# Patient Record
Sex: Female | Born: 1955
Health system: Southern US, Community
[De-identification: ages and names within clinical notes are randomized; demographics above are authoritative.]

## PROBLEM LIST (undated history)

## (undated) DIAGNOSIS — K228 Other specified diseases of esophagus: Secondary | ICD-10-CM

## (undated) DIAGNOSIS — N809 Endometriosis, unspecified: Secondary | ICD-10-CM

## (undated) DIAGNOSIS — D122 Benign neoplasm of ascending colon: Secondary | ICD-10-CM

## (undated) DIAGNOSIS — F4323 Adjustment disorder with mixed anxiety and depressed mood: Secondary | ICD-10-CM

## (undated) DIAGNOSIS — K2289 Other specified disease of esophagus: Secondary | ICD-10-CM

## (undated) DIAGNOSIS — F419 Anxiety disorder, unspecified: Secondary | ICD-10-CM

## (undated) DIAGNOSIS — N816 Rectocele: Secondary | ICD-10-CM

## (undated) DIAGNOSIS — I10 Essential (primary) hypertension: Secondary | ICD-10-CM

## (undated) DIAGNOSIS — I493 Ventricular premature depolarization: Secondary | ICD-10-CM

## (undated) DIAGNOSIS — K219 Gastro-esophageal reflux disease without esophagitis: Secondary | ICD-10-CM

## (undated) DIAGNOSIS — I499 Cardiac arrhythmia, unspecified: Secondary | ICD-10-CM

## (undated) HISTORY — PX: BLADDER SURGERY: SHX569

## (undated) HISTORY — DX: Other specified disease of esophagus: K22.89

## (undated) HISTORY — DX: Anxiety disorder, unspecified: F41.9

## (undated) HISTORY — PX: PELVIC LAPAROSCOPY: SHX162

## (undated) HISTORY — DX: Adjustment disorder with mixed anxiety and depressed mood: F43.23

## (undated) HISTORY — DX: Rectocele: N81.6

## (undated) HISTORY — DX: Ventricular premature depolarization: I49.3

## (undated) HISTORY — DX: Endometriosis, unspecified: N80.9

## (undated) HISTORY — DX: Gastro-esophageal reflux disease without esophagitis: K21.9

## (undated) HISTORY — DX: Other specified diseases of esophagus: K22.8

## (undated) HISTORY — DX: Benign neoplasm of ascending colon: D12.2

---

## 1999-11-14 ENCOUNTER — Other Ambulatory Visit: Admission: RE | Admit: 1999-11-14 | Discharge: 1999-11-14 | Payer: Self-pay | Admitting: Obstetrics and Gynecology

## 2000-11-19 ENCOUNTER — Other Ambulatory Visit: Admission: RE | Admit: 2000-11-19 | Discharge: 2000-11-19 | Payer: Self-pay | Admitting: Obstetrics and Gynecology

## 2000-12-05 ENCOUNTER — Ambulatory Visit (HOSPITAL_COMMUNITY): Admission: RE | Admit: 2000-12-05 | Discharge: 2000-12-05 | Payer: Self-pay | Admitting: Obstetrics and Gynecology

## 2000-12-05 ENCOUNTER — Encounter: Payer: Self-pay | Admitting: Obstetrics and Gynecology

## 2001-12-09 ENCOUNTER — Other Ambulatory Visit: Admission: RE | Admit: 2001-12-09 | Discharge: 2001-12-09 | Payer: Self-pay | Admitting: Obstetrics and Gynecology

## 2002-01-13 ENCOUNTER — Encounter: Payer: Self-pay | Admitting: Obstetrics and Gynecology

## 2002-01-13 ENCOUNTER — Ambulatory Visit (HOSPITAL_COMMUNITY): Admission: RE | Admit: 2002-01-13 | Discharge: 2002-01-13 | Payer: Self-pay | Admitting: Obstetrics and Gynecology

## 2003-01-12 ENCOUNTER — Other Ambulatory Visit: Admission: RE | Admit: 2003-01-12 | Discharge: 2003-01-12 | Payer: Self-pay | Admitting: Obstetrics and Gynecology

## 2003-02-02 ENCOUNTER — Ambulatory Visit (HOSPITAL_COMMUNITY): Admission: RE | Admit: 2003-02-02 | Discharge: 2003-02-02 | Payer: Self-pay | Admitting: Obstetrics and Gynecology

## 2003-02-02 ENCOUNTER — Encounter: Payer: Self-pay | Admitting: Obstetrics and Gynecology

## 2004-02-29 ENCOUNTER — Other Ambulatory Visit: Admission: RE | Admit: 2004-02-29 | Discharge: 2004-02-29 | Payer: Self-pay | Admitting: Obstetrics and Gynecology

## 2004-06-05 ENCOUNTER — Ambulatory Visit (HOSPITAL_COMMUNITY): Admission: RE | Admit: 2004-06-05 | Discharge: 2004-06-05 | Payer: Self-pay | Admitting: Obstetrics and Gynecology

## 2006-09-02 ENCOUNTER — Ambulatory Visit: Payer: Self-pay

## 2007-01-15 ENCOUNTER — Ambulatory Visit: Payer: Self-pay | Admitting: Gastroenterology

## 2007-12-24 ENCOUNTER — Ambulatory Visit: Payer: Self-pay | Admitting: Internal Medicine

## 2011-01-18 ENCOUNTER — Ambulatory Visit: Payer: Self-pay | Admitting: Orthopedic Surgery

## 2011-04-26 ENCOUNTER — Ambulatory Visit: Payer: Self-pay | Admitting: Orthopedic Surgery

## 2011-05-03 ENCOUNTER — Ambulatory Visit: Payer: Self-pay | Admitting: Orthopedic Surgery

## 2011-06-07 ENCOUNTER — Ambulatory Visit: Payer: Self-pay | Admitting: Internal Medicine

## 2014-01-13 ENCOUNTER — Other Ambulatory Visit (HOSPITAL_COMMUNITY)
Admission: RE | Admit: 2014-01-13 | Discharge: 2014-01-13 | Disposition: A | Discharge status: Home / Self Care | Payer: BC Managed Care – PPO | Source: Ambulatory Visit | Attending: Gynecology | Admitting: Gynecology

## 2014-01-13 ENCOUNTER — Ambulatory Visit (INDEPENDENT_AMBULATORY_CARE_PROVIDER_SITE_OTHER): Payer: BC Managed Care – PPO | Admitting: Women's Health

## 2014-01-13 ENCOUNTER — Encounter: Payer: Self-pay | Admitting: Women's Health

## 2014-01-13 VITALS — BP 118/74 | Ht 65.0 in | Wt 146.0 lb

## 2014-01-13 DIAGNOSIS — F4323 Adjustment disorder with mixed anxiety and depressed mood: Secondary | ICD-10-CM

## 2014-01-13 DIAGNOSIS — Z780 Asymptomatic menopausal state: Secondary | ICD-10-CM

## 2014-01-13 DIAGNOSIS — Z01419 Encounter for gynecological examination (general) (routine) without abnormal findings: Secondary | ICD-10-CM | POA: Diagnosis present

## 2014-01-13 DIAGNOSIS — N816 Rectocele: Secondary | ICD-10-CM

## 2014-01-13 DIAGNOSIS — N898 Other specified noninflammatory disorders of vagina: Secondary | ICD-10-CM

## 2014-01-13 HISTORY — DX: Adjustment disorder with mixed anxiety and depressed mood: F43.23

## 2014-01-13 HISTORY — DX: Rectocele: N81.6

## 2014-01-13 LAB — WET PREP FOR TRICH, YEAST, CLUE
CLUE CELLS WET PREP: NONE SEEN
Trich, Wet Prep: NONE SEEN
Yeast Wet Prep HPF POC: NONE SEEN

## 2014-01-13 MED ORDER — FLUOXETINE HCL 20 MG PO TABS: 20.0000 mg | ORAL_TABLET | Freq: Every day | ORAL | Status: DC

## 2014-01-13 MED ORDER — ESTRADIOL ACETATE 0.05 MG/24HR VA RING: VAGINAL_RING | VAGINAL | Status: DC

## 2014-01-13 MED ORDER — PROGESTERONE MICRONIZED 200 MG PO CAPS: ORAL_CAPSULE | ORAL | Status: DC

## 2014-01-13 NOTE — Addendum Note (Signed)
Addended by: Alen Blew on: 01/13/2014 12:22 PM   Modules accepted: Orders

## 2014-01-13 NOTE — Patient Instructions (Signed)
Health Recommendations for Postmenopausal Women Respected and ongoing research has looked at the most common causes of death, disability, and poor quality of life in postmenopausal women. The causes include heart disease, diseases of blood vessels, diabetes, depression, cancer, and bone loss (osteoporosis). Many things can be done to help lower the chances of developing these and other common problems. CARDIOVASCULAR DISEASE Heart Disease: A heart attack is a medical emergency. Know the signs and symptoms of a heart attack. Below are things women can do to reduce their risk for heart disease.   Do not smoke. If you smoke, quit.  Aim for a healthy weight. Being overweight causes many preventable deaths. Eat a healthy and balanced diet and drink an adequate amount of liquids.  Get moving. Make a commitment to be more physically active. Aim for 30 minutes of activity on most, if not all days of the week.  Eat for heart health. Choose a diet that is low in saturated fat and cholesterol and eliminate trans fat. Include whole grains, vegetables, and fruits. Read and understand the labels on food containers before buying.  Know your numbers. Ask your caregiver to check your blood pressure, cholesterol (total, HDL, LDL, triglycerides) and blood glucose. Work with your caregiver on improving your entire clinical picture.  High blood pressure. Limit or stop your table salt intake (try salt substitute and food seasonings). Avoid salty foods and drinks. Read labels on food containers before buying. Eating well and exercising can help control high blood pressure. STROKE  Stroke is a medical emergency. Stroke may be the result of a blood clot in a blood vessel in the brain or by a brain hemorrhage (bleeding). Know the signs and symptoms of a stroke. To lower the risk of developing a stroke:  Avoid fatty foods.  Quit smoking.  Control your diabetes, blood pressure, and irregular heart rate. THROMBOPHLEBITIS  (BLOOD CLOT) OF THE LEG  Becoming overweight and leading a stationary lifestyle may also contribute to developing blood clots. Controlling your diet and exercising will help lower the risk of developing blood clots. CANCER SCREENING  Breast Cancer: Take steps to reduce your risk of breast cancer.  You should practice "breast self-awareness." This means understanding the normal appearance and feel of your breasts and should include breast self-examination. Any changes detected, no matter how small, should be reported to your caregiver.  After age 40, you should have a clinical breast exam (CBE) every year.  Starting at age 40, you should consider having a mammogram (breast X-ray) every year.  If you have a family history of breast cancer, talk to your caregiver about genetic screening.  If you are at high risk for breast cancer, talk to your caregiver about having an MRI and a mammogram every year.  Intestinal or Stomach Cancer: Tests to consider are a rectal exam, fecal occult blood, sigmoidoscopy, and colonoscopy. Women who are high risk may need to be screened at an earlier age and more often.  Cervical Cancer:  Beginning at age 30, you should have a Pap test every 3 years as long as the past 3 Pap tests have been normal.  If you have had past treatment for cervical cancer or a condition that could lead to cancer, you need Pap tests and screening for cancer for at least 20 years after your treatment.  If you had a hysterectomy for a problem that was not cancer or a condition that could lead to cancer, then you no longer need Pap tests.    If you are between ages 65 and 70, and you have had normal Pap tests going back 10 years, you no longer need Pap tests.  If Pap tests have been discontinued, risk factors (such as a new sexual partner) need to be reassessed to determine if screening should be resumed.  Some medical problems can increase the chance of getting cervical cancer. In these  cases, your caregiver may recommend more frequent screening and Pap tests.  Uterine Cancer: If you have vaginal bleeding after reaching menopause, you should notify your caregiver.  Ovarian Cancer: Other than yearly pelvic exams, there are no reliable tests available to screen for ovarian cancer at this time except for yearly pelvic exams.  Lung Cancer: Yearly chest X-rays can detect lung cancer and should be done on high risk women, such as cigarette smokers and women with chronic lung disease (emphysema).  Skin Cancer: A complete body skin exam should be done at your yearly examination. Avoid overexposure to the sun and ultraviolet light lamps. Use a strong sun block cream when in the sun. All of these things are important for lowering the risk of skin cancer. MENOPAUSE Menopause Symptoms: Hormone therapy products are effective for treating symptoms associated with menopause:  Moderate to severe hot flashes.  Night sweats.  Mood swings.  Headaches.  Tiredness.  Loss of sex drive.  Insomnia.  Other symptoms. Hormone replacement carries certain risks, especially in older women. Women who use or are thinking about using estrogen or estrogen with progestin treatments should discuss that with their caregiver. Your caregiver will help you understand the benefits and risks. The ideal dose of hormone replacement therapy is not known. The Food and Drug Administration (FDA) has concluded that hormone therapy should be used only at the lowest doses and for the shortest amount of time to reach treatment goals.  OSTEOPOROSIS Protecting Against Bone Loss and Preventing Fracture If you use hormone therapy for prevention of bone loss (osteoporosis), the risks for bone loss must outweigh the risk of the therapy. Ask your caregiver about other medications known to be safe and effective for preventing bone loss and fractures. To guard against bone loss or fractures, the following is recommended:  If  you are younger than age 50, take 1000 mg of calcium and at least 600 mg of Vitamin D per day.  If you are older than age 50 but younger than age 70, take 1200 mg of calcium and at least 600 mg of Vitamin D per day.  If you are older than age 70, take 1200 mg of calcium and at least 800 mg of Vitamin D per day. Smoking and excessive alcohol intake increases the risk of osteoporosis. Eat foods rich in calcium and vitamin D and do weight bearing exercises several times a week as your caregiver suggests. DIABETES Diabetes Mellitus: If you have type I or type 2 diabetes, you should keep your blood sugar under control with diet, exercise, and recommended medication. Avoid starchy and fatty foods, and too many sweets. Being overweight can make diabetes control more difficult. COGNITION AND MEMORY Cognition and Memory: Menopausal hormone therapy is not recommended for the prevention of cognitive disorders such as Alzheimer's disease or memory loss.  DEPRESSION  Depression may occur at any age, but it is common in elderly women. This may be because of physical, medical, social (loneliness), or financial problems and needs. If you are experiencing depression because of medical problems and control of symptoms, talk to your caregiver about this. Physical   activity and exercise may help with mood and sleep. Community and volunteer involvement may improve your sense of value and worth. If you have depression and you feel that the problem is getting worse or becoming severe, talk to your caregiver about which treatment options are best for you. ACCIDENTS  Accidents are common and can be serious in elderly woman. Prepare your house to prevent accidents. Eliminate throw rugs, place hand bars in bath, shower, and toilet areas. Avoid wearing high heeled shoes or walking on wet, snowy, and icy areas. Limit or stop driving if you have vision or hearing problems, or if you feel you are unsteady with your movements and  reflexes. HEPATITIS C Hepatitis C is a type of viral infection affecting the liver. It is spread mainly through contact with blood from an infected person. It can be treated, but if left untreated, it can lead to severe liver damage over the years. Many people who are infected do not know that the virus is in their blood. If you are a "baby-boomer", it is recommended that you have one screening test for Hepatitis C. IMMUNIZATIONS  Several immunizations are important to consider having during your senior years, including:   Tetanus, diphtheria, and pertussis booster shot.  Influenza every year before the flu season begins.  Pneumonia vaccine.  Shingles vaccine.  Others, as indicated based on your specific needs. Talk to your caregiver about these. Document Released: 06/08/2005 Document Revised: 08/31/2013 Document Reviewed: 02/02/2008 ExitCare Patient Information 2015 [employer]. This information is not intended to replace advice given to you by your health care provider. Make sure you discuss any questions you have with your health care provider.  

## 2014-01-13 NOTE — Progress Notes (Signed)
Connie Thomas 09-01-1955 [id number]    History:    Presents for annual exam. Former patient of Dr. Cherylann Thomas. Has been seen at a physician's office in [place], reports normal Paps and mammograms. Negative colonoscopy at 46. Has not had a bone density. Postmenopausal with no bleeding on FemHRT. Urge incontinence, vaginal dryness/irritation and discomfort with intercourse.  Has been on Cymbalta for the past 2 years and beginning to have increased anxiety symptoms. Had been on Prozac in the past. Counseling in the past, denies need at this time.  Past medical history, past surgical history, family history and social history were all reviewed and documented in the EPIC chart. Retired, taking care of 2 grandchildren 4 days a week.  ROS:  A  12 point ROS was performed and pertinent positives and negatives are included.  Exam:  Filed Vitals:   01/13/14 0834  BP: 118/74    General appearance:  Normal Thyroid:  Symmetrical, normal in size, without palpable masses or nodularity. Respiratory  Auscultation:  Clear without wheezing or rhonchi Cardiovascular  Auscultation:  Regular rate, without rubs, murmurs or gallops  Edema/varicosities:  Not grossly evident Abdominal  Soft,nontender, without masses, guarding or rebound.  Liver/spleen:  No organomegaly noted  Hernia:  None appreciated  Skin  Inspection:  Grossly normal   Breasts: Examined lying and sitting.     Right: Without masses, retractions, discharge or axillary adenopathy.     Left: Without masses, retractions, discharge or axillary adenopathy. Gentitourinary   Inguinal/mons:  Normal without inguinal adenopathy  External genitalia:  Normal  BUS/Urethra/Skene's glands:  Normal  Vagina: +1 rectocele wet prep negative   Cervix:  Normal  Uterus:  normal in size, shape and contour.  Midline and mobile  Adnexa/parametria:     Rt: Without masses or tenderness.   Lt: Without masses or tenderness.  Anus and  perineum: Normal  Digital rectal exam: Normal sphincter tone without palpated masses or tenderness  Assessment/Plan:  58 y.o. MWF G1P1 and 1 adopted son for annual exam.   +1 rectocele/urge incontinence Postmenopausal with no bleeding on FemHRT Anxiety/depression increased symptoms after 2 years on Cymbalta  Plan: Instructed to schedule bone density. Home safety, fall prevention, importance of regular exercise reviewed. HRT reviewed, best to use shortest amount of time possible, options reviewed will try Femring .05 every 3 months and Prometrium 200 mg day one through 12 at each month. Reviewed may help with vaginal dryness, and urinary symptoms, instructed to call if continued problems. Will wean down off the Cymbalta and try Prozac 20 mg daily, prescription, proper use given and reviewed. Instructed to call if no relief of symptoms, and counseling as needed. SBE's, annual mammogram, calcium rich diet, vitamin D 2000 daily encouraged. Pap, reports normal labs at primary care. Continue vaginal lubricants with intercourse.     Note: This dictation was prepared with Dragon/digital dictation.  Any transcriptional errors that result are unintentional. Connie Thomas Connie Thomas, 11:49 AM 01/13/2014

## 2014-01-14 LAB — CYTOLOGY - PAP

## 2014-01-14 LAB — URINALYSIS W MICROSCOPIC + REFLEX CULTURE
Bilirubin Urine: NEGATIVE
Casts: NONE SEEN
Crystals: NONE SEEN
Glucose, UA: NEGATIVE mg/dL
Hgb urine dipstick: NEGATIVE
Ketones, ur: NEGATIVE mg/dL
Nitrite: NEGATIVE
Protein, ur: NEGATIVE mg/dL
Specific Gravity, Urine: 1.013 (ref 1.005–1.030)
Urobilinogen, UA: 0.2 mg/dL (ref 0.0–1.0)
pH: 7.5 (ref 5.0–8.0)

## 2014-01-15 ENCOUNTER — Other Ambulatory Visit: Payer: Self-pay | Admitting: Women's Health

## 2014-01-15 LAB — URINE CULTURE

## 2014-01-15 MED ORDER — FLUCONAZOLE 150 MG PO TABS: 150.0000 mg | ORAL_TABLET | Freq: Once | ORAL | Status: DC

## 2014-01-18 ENCOUNTER — Other Ambulatory Visit: Payer: Self-pay | Admitting: Gynecology

## 2014-01-18 DIAGNOSIS — Z780 Asymptomatic menopausal state: Secondary | ICD-10-CM

## 2014-01-28 ENCOUNTER — Other Ambulatory Visit: Payer: Self-pay | Admitting: Gynecology

## 2014-01-28 ENCOUNTER — Ambulatory Visit (INDEPENDENT_AMBULATORY_CARE_PROVIDER_SITE_OTHER): Payer: BC Managed Care – PPO

## 2014-01-28 DIAGNOSIS — M8580 Other specified disorders of bone density and structure, unspecified site: Secondary | ICD-10-CM

## 2014-01-28 DIAGNOSIS — Z780 Asymptomatic menopausal state: Secondary | ICD-10-CM

## 2014-01-28 DIAGNOSIS — Z8262 Family history of osteoporosis: Secondary | ICD-10-CM

## 2014-03-01 ENCOUNTER — Encounter: Payer: Self-pay | Admitting: Women's Health

## 2014-04-09 ENCOUNTER — Encounter: Payer: Self-pay | Admitting: Women's Health

## 2014-04-09 ENCOUNTER — Ambulatory Visit (INDEPENDENT_AMBULATORY_CARE_PROVIDER_SITE_OTHER): Payer: BC Managed Care – PPO | Admitting: Women's Health

## 2014-04-09 VITALS — BP 126/80 | Ht 65.0 in | Wt 148.0 lb

## 2014-04-09 DIAGNOSIS — F4322 Adjustment disorder with anxiety: Secondary | ICD-10-CM

## 2014-04-09 MED ORDER — DULOXETINE HCL 30 MG PO CPEP: 30.0000 mg | ORAL_CAPSULE | Freq: Every day | ORAL | Status: DC

## 2014-04-09 NOTE — Patient Instructions (Signed)
Generalized Anxiety Disorder Generalized anxiety disorder (GAD) is a mental disorder. It interferes with life functions, including relationships, work, and school. GAD is different from normal anxiety, which everyone experiences at some point in their lives in response to specific life events and activities. Normal anxiety actually helps us prepare for and get through these life events and activities. Normal anxiety goes away after the event or activity is over.  GAD causes anxiety that is not necessarily related to specific events or activities. It also causes excess anxiety in proportion to specific events or activities. The anxiety associated with GAD is also difficult to control. GAD can vary from mild to severe. People with severe GAD can have intense waves of anxiety with physical symptoms (panic attacks).  SYMPTOMS The anxiety and worry associated with GAD are difficult to control. This anxiety and worry are related to many life events and activities and also occur more days than not for 6 months or longer. People with GAD also have three or more of the following symptoms (one or more in children):  Restlessness.   Fatigue.  Difficulty concentrating.   Irritability.  Muscle tension.  Difficulty sleeping or unsatisfying sleep. DIAGNOSIS GAD is diagnosed through an assessment by your health care provider. Your health care provider will ask you questions aboutyour mood,physical symptoms, and events in your life. Your health care provider may ask you about your medical history and use of alcohol or drugs, including prescription medicines. Your health care provider may also do a physical exam and blood tests. Certain medical conditions and the use of certain substances can cause symptoms similar to those associated with GAD. Your health care provider may refer you to a mental health specialist for further evaluation. TREATMENT The following therapies are usually used to treat GAD:    Medication. Antidepressant medication usually is prescribed for long-term daily control. Antianxiety medicines may be added in severe cases, especially when panic attacks occur.   Talk therapy (psychotherapy). Certain types of talk therapy can be helpful in treating GAD by providing support, education, and guidance. A form of talk therapy called cognitive behavioral therapy can teach you healthy ways to think about and react to daily life events and activities.  Stress managementtechniques. These include yoga, meditation, and exercise and can be very helpful when they are practiced regularly. A mental health specialist can help determine which treatment is best for you. Some people see improvement with one therapy. However, other people require a combination of therapies. Document Released: 08/11/2012 Document Revised: 08/31/2013 Document Reviewed: 08/11/2012 ExitCare Patient Information 2015 [employer]. This information is not intended to replace advice given to you by your health care provider. Make sure you discuss any questions you have with your health care provider.  

## 2014-04-09 NOTE — Progress Notes (Signed)
Patient ID: Connie Thomas, female   DOB: February 04, 1956, 58 y.o.   MRN: [id number] Presents to discuss medications. Long-term history of anxiety. Had been on Cymbalta 60 mg had requested a change felt like it was not as effective as it had been in the past, changed in September to Prozac 20 mg daily. States has helped maybe 25% but is beginning to become more anxious. Denies any depression symptoms. Would like to go back on Cymbalta. No suicidal ideation. Has had numerous years of counseling, declines need at this time. Reports good energy, sleeping well.  Exam: Appears well, good eye contact, well-groomed, appropriate responses.  Generalized anxiety  Plan: Will wean off Prozac 20, Cymbalta 30 mg by mouth daily 1 month and then increase to Cymbalta 60 mg after. Counseling as needed, instructed to call if anxiety symptoms persist.

## 2014-05-04 ENCOUNTER — Other Ambulatory Visit: Payer: Self-pay | Admitting: Women's Health

## 2014-05-27 ENCOUNTER — Other Ambulatory Visit: Payer: Self-pay | Admitting: Women's Health

## 2014-05-27 DIAGNOSIS — B3731 Acute candidiasis of vulva and vagina: Secondary | ICD-10-CM

## 2014-05-27 DIAGNOSIS — B373 Candidiasis of vulva and vagina: Secondary | ICD-10-CM

## 2014-05-27 DIAGNOSIS — F4322 Adjustment disorder with anxiety: Secondary | ICD-10-CM

## 2014-05-27 MED ORDER — DULOXETINE HCL 60 MG PO CPEP: 60.0000 mg | ORAL_CAPSULE | Freq: Every day | ORAL | Status: DC

## 2014-05-27 MED ORDER — FLUCONAZOLE 150 MG PO TABS: 150.0000 mg | ORAL_TABLET | Freq: Once | ORAL | Status: DC

## 2014-05-27 NOTE — Telephone Encounter (Signed)
Telephone call to review request. States is doing better on Cymbalta would like to increase to 60, had been on 60 in the past. Denies need for counseling at this time has had counseling in the past. Reviewed will send in refill, counseling as needed. Denies any feelings of harming self. Diflucan 150, if no relief of yeast symptoms office visit.

## 2014-05-27 NOTE — Telephone Encounter (Signed)
Connie Thomas called and said you spoke with her about increasing Cymbalta to 60 mg? Please advise

## 2014-05-27 NOTE — Addendum Note (Signed)
Addended by: Thamas Jaegers on: 05/27/2014 01:57 PM   Modules accepted: Orders, Medications

## 2014-08-22 NOTE — Op Note (Signed)
PATIENT NAME:  Connie Thomas, ROUNDS MR#:  [id number] DATE OF BIRTH:  1956/04/27  DATE OF PROCEDURE:  05/03/2011  PREOPERATIVE DIAGNOSIS: Right knee medial meniscal tear with mild medial femoral condyle chondromalacia.   POSTOPERATIVE DIAGNOSIS: Right knee medial meniscal tear with mild medial femoral condyle chondromalacia.   PROCEDURE: Right knee arthroscopy with partial medial meniscectomy and chondroplasty of medial femoral condyle.   SURGEON: Timoteo Gaul, MD   ANESTHESIA: General.   ESTIMATED BLOOD LOSS: Minimal.   COMPLICATIONS: None.   INDICATIONS FOR THE PROCEDURE: The patient is a 59 year old female who has had persistent pain in the medial side of her right knee particularly with activity requiring knee flexion. The pain was isolated to the medial joint line. An MRI has confirmed a medial meniscus tear. The patient had failed to improve over time despite having symptoms since June of 2012. She did not respond to nonsteroidal anti-inflammatories, rest and activity modification.   I reviewed the risks and benefits of surgery with the patient. The risks include infection, bleeding, nerve or blood vessel injury, knee stiffness, persistent pain, the need for further surgery, osteoarthritis, DVT and pulmonary embolism, myocardial infarction, pneumonia, stroke, respiratory failure, and death. The patient signed the consent form in my office prior to the date of surgery after understanding these risks. I answered all the patient's questions preoperatively.   PROCEDURE NOTE: The patient was met in the preop area on the date of surgery. I signed the right knee with the word yes within the operative field after verbally confirming that this was the correct site of surgery. This was also confirmed with my office notes and radiographic studies. The patient was then brought to the operating room where she was placed supine on the operative table. She underwent general anesthesia. All bony  prominences were adequately padded. The patient was then prepped and draped in a sterile fashion.   A time-out was performed to verify the patient's name, date of birth, medical record number, correct site of surgery, and correct procedure to be performed. It was also used to verify the patient had received antibiotics and that all appropriate instruments and radiographic studies were available in the room. Once all in attendance were in agreement, the case began.   After the patient was prepped and draped in a sterile fashion, the proposed arthroscopy incisions were drawn out with a surgical marker based upon bony landmarks. These were pre-injected with 1% lidocaine plain. An 11 blade was used to establish inferior lateral and medial portals. Inferomedial portal was performed under direct visualization using an 18-gauge spinal needle for localization. The arthroscope was placed at the inferior lateral portal and a full diagnostic examination of the knee was performed.   Findings on arthroscopy included grade II and III chondral changes of the posterior portion of the medial femoral condyle. This area does involve a portion of the weightbearing surface of the medial femoral condyle. The patient also had a complex tear of the body and posterior horn of the medial meniscus.   A chondroplasty was performed on the medial femoral condyle using a 4-0 resector shaver blade to remove any loose flaps of cartilage. There was no full-thickness cartilage loss seen. The attention was then turned to the medial meniscus tear which was removed using a 3.5 and 4-0 resector shaver blades as well as a straight basket. The meniscus was contoured to avoid any sharp edges which may propagate tear or persistent mechanical symptoms. The knee was then copiously lavaged to remove  any chondral or meniscal debris. All arthroscopic instruments were then removed. The two arthroscopy incisions were closed with simple 4-0 nylon sutures and  a dry sterile compressive dressing was applied to the right knee. The patient was awakened and brought to the PAC-U in stable condition. I was scrubbed and present for the entire case and all sharp and instrument counts were correct at the conclusion of the case.   I spoke with the patient's husband in the postop consultation room to let him know that his wife was stable in recovery and the case had gone without complication. The patient will follow-up with me in 7 to 10 days for wound check and re-evaluation.      She will take enteric-coated aspirin 325 mg daily for DVT prophylaxis and will be weightbearing as tolerated on crutches postoperatively.    ____________________________ Timoteo Gaul, MD [id number] D: 05/04/2011 16:06:20 ET T: 05/04/2011 16:40:30 ET JOB#: [id number]  cc: Timoteo Gaul, MD, <Dictator> Timoteo Gaul MD ELECTRONICALLY SIGNED 05/07/2011 13:36

## 2015-01-18 ENCOUNTER — Encounter: Payer: Self-pay | Admitting: Women's Health

## 2015-01-18 ENCOUNTER — Ambulatory Visit (INDEPENDENT_AMBULATORY_CARE_PROVIDER_SITE_OTHER): Payer: 59 | Admitting: Women's Health

## 2015-01-18 VITALS — BP 124/80 | Ht 64.0 in | Wt 146.0 lb

## 2015-01-18 DIAGNOSIS — Z01419 Encounter for gynecological examination (general) (routine) without abnormal findings: Secondary | ICD-10-CM

## 2015-01-18 DIAGNOSIS — M8580 Other specified disorders of bone density and structure, unspecified site: Secondary | ICD-10-CM | POA: Diagnosis not present

## 2015-01-18 DIAGNOSIS — F4322 Adjustment disorder with anxiety: Secondary | ICD-10-CM | POA: Diagnosis not present

## 2015-01-18 LAB — LIPID PANEL
CHOL/HDL RATIO: 3.2 ratio (ref <=5.0)
Cholesterol: 232 mg/dL — ABNORMAL HIGH (ref 125–200)
HDL: 73 mg/dL (ref >=46)
LDL CALC: 143 mg/dL — AB (ref <130)
Triglycerides: 80 mg/dL (ref <150)
VLDL: 16 mg/dL (ref <30)

## 2015-01-18 LAB — COMPREHENSIVE METABOLIC PANEL
ALT: 17 U/L (ref 6–29)
AST: 19 U/L (ref 10–35)
Albumin: 4.3 g/dL (ref 3.6–5.1)
Alkaline Phosphatase: 45 U/L (ref 33–130)
BUN: 10 mg/dL (ref 7–25)
CHLORIDE: 101 mmol/L (ref 98–110)
CO2: 29 mmol/L (ref 20–31)
CREATININE: 0.58 mg/dL (ref 0.50–1.05)
Calcium: 9.1 mg/dL (ref 8.6–10.4)
Glucose, Bld: 79 mg/dL (ref 65–99)
POTASSIUM: 4.4 mmol/L (ref 3.5–5.3)
SODIUM: 139 mmol/L (ref 135–146)
Total Bilirubin: 0.3 mg/dL (ref 0.2–1.2)
Total Protein: 6.4 g/dL (ref 6.1–8.1)

## 2015-01-18 LAB — CBC WITH DIFFERENTIAL/PLATELET
BASOS PCT: 1 % (ref 0–1)
Basophils Absolute: 0 10*3/uL (ref 0.0–0.1)
EOS ABS: 0.1 10*3/uL (ref 0.0–0.7)
EOS PCT: 3 % (ref 0–5)
HCT: 36.8 % (ref 36.0–46.0)
Hemoglobin: 12.7 g/dL (ref 12.0–15.0)
Lymphocytes Relative: 49 % — ABNORMAL HIGH (ref 12–46)
Lymphs Abs: 2.2 10*3/uL (ref 0.7–4.0)
MCH: 30.9 pg (ref 26.0–34.0)
MCHC: 34.5 g/dL (ref 30.0–36.0)
MCV: 89.5 fL (ref 78.0–100.0)
MONO ABS: 0.3 10*3/uL (ref 0.1–1.0)
MONOS PCT: 6 % (ref 3–12)
MPV: 9 fL (ref 8.6–12.4)
NEUTROS PCT: 41 % — AB (ref 43–77)
Neutro Abs: 1.8 10*3/uL (ref 1.7–7.7)
PLATELETS: 228 10*3/uL (ref 150–400)
RBC: 4.11 MIL/uL (ref 3.87–5.11)
RDW: 13.7 % (ref 11.5–15.5)
WBC: 4.4 10*3/uL (ref 4.0–10.5)

## 2015-01-18 MED ORDER — ALPRAZOLAM 0.25 MG PO TABS: 0.2500 mg | ORAL_TABLET | Freq: Every evening | ORAL | Status: DC | PRN

## 2015-01-18 MED ORDER — DULOXETINE HCL 60 MG PO CPEP: 60.0000 mg | ORAL_CAPSULE | Freq: Every day | ORAL | Status: AC

## 2015-01-18 MED ORDER — OMEPRAZOLE 40 MG PO CPDR
40.0000 mg | DELAYED_RELEASE_CAPSULE | Freq: Every day | ORAL | Status: DC
Start: 2015-01-18 — End: 2016-06-11

## 2015-01-18 NOTE — Patient Instructions (Signed)
Health Recommendations for Postmenopausal Women Respected and ongoing research has looked at the most common causes of death, disability, and poor quality of life in postmenopausal women. The causes include heart disease, diseases of blood vessels, diabetes, depression, cancer, and bone loss (osteoporosis). Many things can be done to help lower the chances of developing these and other common problems. CARDIOVASCULAR DISEASE Heart Disease: A heart attack is a medical emergency. Know the signs and symptoms of a heart attack. Below are things women can do to reduce their risk for heart disease.   Do not smoke. If you smoke, quit.  Aim for a healthy weight. Being overweight causes many preventable deaths. Eat a healthy and balanced diet and drink an adequate amount of liquids.  Get moving. Make a commitment to be more physically active. Aim for 30 minutes of activity on most, if not all days of the week.  Eat for heart health. Choose a diet that is low in saturated fat and cholesterol and eliminate trans fat. Include whole grains, vegetables, and fruits. Read and understand the labels on food containers before buying.  Know your numbers. Ask your caregiver to check your blood pressure, cholesterol (total, HDL, LDL, triglycerides) and blood glucose. Work with your caregiver on improving your entire clinical picture.  High blood pressure. Limit or stop your table salt intake (try salt substitute and food seasonings). Avoid salty foods and drinks. Read labels on food containers before buying. Eating well and exercising can help control high blood pressure. STROKE  Stroke is a medical emergency. Stroke may be the result of a blood clot in a blood vessel in the brain or by a brain hemorrhage (bleeding). Know the signs and symptoms of a stroke. To lower the risk of developing a stroke:  Avoid fatty foods.  Quit smoking.  Control your diabetes, blood pressure, and irregular heart rate. THROMBOPHLEBITIS  (BLOOD CLOT) OF THE LEG  Becoming overweight and leading a stationary lifestyle may also contribute to developing blood clots. Controlling your diet and exercising will help lower the risk of developing blood clots. CANCER SCREENING  Breast Cancer: Take steps to reduce your risk of breast cancer.  You should practice "breast self-awareness." This means understanding the normal appearance and feel of your breasts and should include breast self-examination. Any changes detected, no matter how small, should be reported to your caregiver.  After age 4, you should have a clinical breast exam (CBE) every year.  Starting at age 48, you should consider having a mammogram (breast X-ray) every year.  If you have a family history of breast cancer, talk to your caregiver about genetic screening.  If you are at high risk for breast cancer, talk to your caregiver about having an MRI and a mammogram every year.  Intestinal or Stomach Cancer: Tests to consider are a rectal exam, fecal occult blood, sigmoidoscopy, and colonoscopy. Women who are high risk may need to be screened at an earlier age and more often.  Cervical Cancer:  Beginning at age 72, you should have a Pap test every 3 years as long as the past 3 Pap tests have been normal.  If you have had past treatment for cervical cancer or a condition that could lead to cancer, you need Pap tests and screening for cancer for at least 20 years after your treatment.  If you had a hysterectomy for a problem that was not cancer or a condition that could lead to cancer, then you no longer need Pap tests.  If you are between ages 65 and 70, and you have had normal Pap tests going back 10 years, you no longer need Pap tests.  If Pap tests have been discontinued, risk factors (such as a new sexual partner) need to be reassessed to determine if screening should be resumed.  Some medical problems can increase the chance of getting cervical cancer. In these  cases, your caregiver may recommend more frequent screening and Pap tests.  Uterine Cancer: If you have vaginal bleeding after reaching menopause, you should notify your caregiver.  Ovarian Cancer: Other than yearly pelvic exams, there are no reliable tests available to screen for ovarian cancer at this time except for yearly pelvic exams.  Lung Cancer: Yearly chest X-rays can detect lung cancer and should be done on high risk women, such as cigarette smokers and women with chronic lung disease (emphysema).  Skin Cancer: A complete body skin exam should be done at your yearly examination. Avoid overexposure to the sun and ultraviolet light lamps. Use a strong sun block cream when in the sun. All of these things are important for lowering the risk of skin cancer. MENOPAUSE Menopause Symptoms: Hormone therapy products are effective for treating symptoms associated with menopause:  Moderate to severe hot flashes.  Night sweats.  Mood swings.  Headaches.  Tiredness.  Loss of sex drive.  Insomnia.  Other symptoms. Hormone replacement carries certain risks, especially in older women. Women who use or are thinking about using estrogen or estrogen with progestin treatments should discuss that with their caregiver. Your caregiver will help you understand the benefits and risks. The ideal dose of hormone replacement therapy is not known. The Food and Drug Administration (FDA) has concluded that hormone therapy should be used only at the lowest doses and for the shortest amount of time to reach treatment goals.  OSTEOPOROSIS Protecting Against Bone Loss and Preventing Fracture If you use hormone therapy for prevention of bone loss (osteoporosis), the risks for bone loss must outweigh the risk of the therapy. Ask your caregiver about other medications known to be safe and effective for preventing bone loss and fractures. To guard against bone loss or fractures, the following is recommended:  If  you are younger than age 50, take 1000 mg of calcium and at least 600 mg of Vitamin D per day.  If you are older than age 50 but younger than age 70, take 1200 mg of calcium and at least 600 mg of Vitamin D per day.  If you are older than age 70, take 1200 mg of calcium and at least 800 mg of Vitamin D per day. Smoking and excessive alcohol intake increases the risk of osteoporosis. Eat foods rich in calcium and vitamin D and do weight bearing exercises several times a week as your caregiver suggests. DIABETES Diabetes Mellitus: If you have type I or type 2 diabetes, you should keep your blood sugar under control with diet, exercise, and recommended medication. Avoid starchy and fatty foods, and too many sweets. Being overweight can make diabetes control more difficult. COGNITION AND MEMORY Cognition and Memory: Menopausal hormone therapy is not recommended for the prevention of cognitive disorders such as Alzheimer's disease or memory loss.  DEPRESSION  Depression may occur at any age, but it is common in elderly women. This may be because of physical, medical, social (loneliness), or financial problems and needs. If you are experiencing depression because of medical problems and control of symptoms, talk to your caregiver about this. Physical   activity and exercise may help with mood and sleep. Community and volunteer involvement may improve your sense of value and worth. If you have depression and you feel that the problem is getting worse or becoming severe, talk to your caregiver about which treatment options are best for you. ACCIDENTS  Accidents are common and can be serious in elderly woman. Prepare your house to prevent accidents. Eliminate throw rugs, place hand bars in bath, shower, and toilet areas. Avoid wearing high heeled shoes or walking on wet, snowy, and icy areas. Limit or stop driving if you have vision or hearing problems, or if you feel you are unsteady with your movements and  reflexes. HEPATITIS C Hepatitis C is a type of viral infection affecting the liver. It is spread mainly through contact with blood from an infected person. It can be treated, but if left untreated, it can lead to severe liver damage over the years. Many people who are infected do not know that the virus is in their blood. If you are a "baby-boomer", it is recommended that you have one screening test for Hepatitis C. IMMUNIZATIONS  Several immunizations are important to consider having during your senior years, including:   Tetanus, diphtheria, and pertussis booster shot.  Influenza every year before the flu season begins.  Pneumonia vaccine.  Shingles vaccine.  Others, as indicated based on your specific needs. Talk to your caregiver about these. Document Released: 06/08/2005 Document Revised: 08/31/2013 Document Reviewed: 02/02/2008 ExitCare Patient Information 2015 [employer]. This information is not intended to replace advice given to you by your health care Connie Thomas. Make sure you discuss any questions you have with your health care Connie Thomas. Exercise to Stay Healthy Exercise helps you become and stay healthy. EXERCISE IDEAS AND TIPS Choose exercises that:  You enjoy.  Fit into your day. You do not need to exercise really hard to be healthy. You can do exercises at a slow or medium level and stay healthy. You can:  Stretch before and after working out.  Try yoga, Pilates, or tai chi.  Lift weights.  Walk fast, swim, jog, run, climb stairs, bicycle, dance, or rollerskate.  Take aerobic classes. Exercises that burn about 150 calories:  Running 1  miles in 15 minutes.  Playing volleyball for 45 to 60 minutes.  Washing and waxing a car for 45 to 60 minutes.  Playing touch football for 45 minutes.  Walking 1  miles in 35 minutes.  Pushing a stroller 1  miles in 30 minutes.  Playing basketball for 30 minutes.  Raking leaves for 30 minutes.  Bicycling 5  miles in 30 minutes.  Walking 2 miles in 30 minutes.  Dancing for 30 minutes.  Shoveling snow for 15 minutes.  Swimming laps for 20 minutes.  Walking up stairs for 15 minutes.  Bicycling 4 miles in 15 minutes.  Gardening for 30 to 45 minutes.  Jumping rope for 15 minutes.  Washing windows or floors for 45 to 60 minutes. Document Released: 05/19/2010 Document Revised: 07/09/2011 Document Reviewed: 05/19/2010 ExitCare Patient Information 2015 [employer]. This information is not intended to replace advice given to you by your health care Connie Thomas. Make sure you discuss any questions you have with your health care Connie Thomas.  

## 2015-01-18 NOTE — Progress Notes (Signed)
Connie Thomas 09-13-1955 [id number]    History:    Presents for annual exam.  Postmenopausal on no HRT, stopped this past year. Normal Pap and mammogram history. Anxiety and depression stable on Cymbalta. 2015 DEXA -1.9 left femoral neck FRAX 8.5%/1%. Negative colonoscopy at 2. Husband history of MI, not sexually active.  Past medical history, past surgical history, family history and social history were all reviewed and documented in the EPIC chart. Homemaker. Parents lung cancer, hypertension, mother stroke. Daughter recently moved to [place].  ROS:  A ROS was performed and pertinent positives and negatives are included.  Exam:  Filed Vitals:   01/18/15 0827  BP: 124/80    General appearance:  Normal Thyroid:  Symmetrical, normal in size, without palpable masses or nodularity. Respiratory  Auscultation:  Clear without wheezing or rhonchi Cardiovascular  Auscultation:  Regular rate, without rubs, murmurs or gallops  Edema/varicosities:  Not grossly evident Abdominal  Soft,nontender, without masses, guarding or rebound.  Liver/spleen:  No organomegaly noted  Hernia:  None appreciated  Skin  Inspection:  Grossly normal   Breasts: Examined lying and sitting.     Right: Without masses, retractions, discharge or axillary adenopathy.     Left: Without masses, retractions, discharge or axillary adenopathy. Gentitourinary   Inguinal/mons:  Normal without inguinal adenopathy  External genitalia:  Normal  BUS/Urethra/Skene's glands:  Normal  Vagina:  +1 rectocele  Cervix:  Normal  Uterus:   normal in size, shape and contour.  Midline and mobile  Adnexa/parametria:     Rt: Without masses or tenderness.   Lt: Without masses or tenderness.  Anus and perineum: Normal  Digital rectal exam: Normal sphincter tone without palpated masses or tenderness  Assessment/Plan:  59 y.o. MWF G2 P1 for annual exam with no complaints.  Postmenopausal/no HRT/no bleeding +1 rectocele  asymptomatic Anxiety/depression stable on Cymbalta  Plan: Overdue for mammogram, reviewed importance of annual screening mammogram instructed to schedule, breast center information given. SBE's, exercise, calcium rich diet, vitamin D 1000 daily encouraged. Cymbalta 60 mg by mouth prescription, proper use given. Reviewed importance of exercise, leisure activities. CBC, lipid panel, CMP, vitamin D, TSH, UA, Pap normal 2015, new screening guidelines reviewed.    [place], 1:58 PM 01/18/2015

## 2015-01-19 ENCOUNTER — Other Ambulatory Visit: Payer: Self-pay | Admitting: Women's Health

## 2015-01-19 LAB — URINALYSIS W MICROSCOPIC + REFLEX CULTURE
BACTERIA UA: NONE SEEN [HPF]
Bilirubin Urine: NEGATIVE
Casts: NONE SEEN [LPF]
Crystals: NONE SEEN [HPF]
GLUCOSE, UA: NEGATIVE
Hgb urine dipstick: NEGATIVE
Ketones, ur: NEGATIVE
NITRITE: NEGATIVE
PROTEIN: NEGATIVE
RBC / HPF: NONE SEEN RBC/HPF (ref <=2)
SPECIFIC GRAVITY, URINE: 1.013 (ref 1.001–1.035)
WBC UA: NONE SEEN WBC/HPF (ref <=5)
YEAST: NONE SEEN [HPF]
pH: 8 (ref 5.0–8.0)

## 2015-01-19 LAB — VITAMIN D 25 HYDROXY (VIT D DEFICIENCY, FRACTURES): Vit D, 25-Hydroxy: 23 ng/mL — ABNORMAL LOW (ref 30–100)

## 2015-01-19 MED ORDER — VITAMIN D (ERGOCALCIFEROL) 1.25 MG (50000 UNIT) PO CAPS: 50000.0000 [IU] | ORAL_CAPSULE | ORAL | Status: DC

## 2015-01-20 LAB — URINE CULTURE: Colony Count: 75000

## 2015-02-23 ENCOUNTER — Other Ambulatory Visit: Payer: Self-pay

## 2015-02-23 DIAGNOSIS — Z1231 Encounter for screening mammogram for malignant neoplasm of breast: Secondary | ICD-10-CM

## 2015-03-14 ENCOUNTER — Other Ambulatory Visit: Payer: Self-pay | Admitting: Women's Health

## 2015-03-16 ENCOUNTER — Ambulatory Visit
Admission: RE | Admit: 2015-03-16 | Discharge: 2015-03-16 | Disposition: A | Discharge status: Home / Self Care | Payer: 59 | Source: Ambulatory Visit

## 2015-03-16 DIAGNOSIS — Z1231 Encounter for screening mammogram for malignant neoplasm of breast: Secondary | ICD-10-CM

## 2015-03-19 ENCOUNTER — Other Ambulatory Visit: Payer: Self-pay | Admitting: Women's Health

## 2015-03-21 NOTE — Telephone Encounter (Signed)
Called into pharmacy

## 2016-01-19 ENCOUNTER — Encounter: Payer: 59 | Admitting: Women's Health

## 2016-04-10 ENCOUNTER — Telehealth: Payer: Self-pay | Admitting: Gastroenterology

## 2016-04-10 DIAGNOSIS — K2100 Gastro-esophageal reflux disease with esophagitis, without bleeding: Secondary | ICD-10-CM | POA: Insufficient documentation

## 2016-04-10 NOTE — Telephone Encounter (Signed)
Colonoscopy and EGD with Dr. Allen Norris

## 2016-04-13 ENCOUNTER — Other Ambulatory Visit: Payer: Self-pay

## 2016-04-16 NOTE — Telephone Encounter (Signed)
Pt scheduled for a screening colonoscopy at [facility] with wohl on 05/04/16. Please precert.

## 2016-04-16 NOTE — Telephone Encounter (Signed)
Gastroenterology Pre-Procedure Review  Request Date:  Requesting Physician: Dr.   PATIENT REVIEW QUESTIONS: The patient responded to the following health history questions as indicated:    1. Are you having any GI issues? no 2. Do you have a personal history of Polyps? no 3. Do you have a family history of Colon Cancer or Polyps? no 4. Diabetes Mellitus? no 5. Joint replacements in the past 12 months?no 6. Major health problems in the past 3 months?no 7. Any artificial heart valves, MVP, or defibrillator?no    MEDICATIONS & ALLERGIES:    Patient reports the following regarding taking any anticoagulation/antiplatelet therapy:   Plavix, Coumadin, Eliquis, Xarelto, Lovenox, Pradaxa, Brilinta, or Effient? no Aspirin? no  Patient confirms/reports the following medications:  Current Outpatient Prescriptions  Medication Sig Dispense Refill  . ALPRAZolam (XANAX) 0.25 MG tablet TAKE ONE TABLET BY MOUTH AT BEDTIME AS NEEDED FOR ANXIETY 30 tablet 1  . DULoxetine (CYMBALTA) 60 MG capsule Take 1 capsule (60 mg total) by mouth daily. 90 capsule 4  . omeprazole (PRILOSEC) 40 MG capsule Take 1 capsule (40 mg total) by mouth daily. 90 capsule 4  . Probiotic Product (PROBIOTIC PO) Take 1 tablet by mouth daily.     . SENNA PO Take by mouth as needed.     . Vitamin D, Ergocalciferol, (DRISDOL) 50000 UNITS CAPS capsule Take 1 capsule (50,000 Units total) by mouth every 7 (seven) days. 12 capsule 0   No current facility-administered medications for this visit.     Patient confirms/reports the following allergies:  No Known Allergies  No orders of the defined types were placed in this encounter.   AUTHORIZATION INFORMATION Primary Insurance: 1D#: Group #:  Secondary Insurance: 1D#: Group #:  SCHEDULE INFORMATION: Date: 05/04/16 Time: Location: [place]

## 2016-04-26 ENCOUNTER — Telehealth: Payer: Self-pay | Admitting: Gastroenterology

## 2016-04-26 NOTE — Telephone Encounter (Signed)
Patient left a voice message that she needs to reschedule her double on 1/5

## 2016-04-26 NOTE — Telephone Encounter (Signed)
LVM for pt to return my call.

## 2016-05-01 ENCOUNTER — Telehealth: Payer: Self-pay

## 2016-05-01 ENCOUNTER — Telehealth: Payer: Self-pay | Admitting: Gastroenterology

## 2016-05-01 NOTE — Telephone Encounter (Signed)
The notification/prior authorization case information was transmitted on 05/01/2016 at 10:03 AM CST. The notification/prior authorization reference number is [id number]

## 2016-05-01 NOTE — Telephone Encounter (Signed)
Patient called and needs to reschedule her upper and lower that was scheduled for this Friday. Please call [phone]

## 2016-05-02 NOTE — Discharge Instructions (Signed)

## 2016-05-02 NOTE — Telephone Encounter (Addendum)
Pt rescheduled colonoscopy to 05/25/16. [place] notified. Please change date for authorization.

## 2016-05-02 NOTE — Telephone Encounter (Signed)
Pt rescheduled procedures to 05/25/16. [place] notified.

## 2016-05-04 NOTE — Telephone Encounter (Signed)
The notification/prior authorization case information was transmitted on 05/04/2016 at 9:46 AM CST. The notification/prior authorization reference number is [id number]

## 2016-05-08 DIAGNOSIS — I493 Ventricular premature depolarization: Secondary | ICD-10-CM | POA: Diagnosis not present

## 2016-05-08 HISTORY — DX: Ventricular premature depolarization: I49.3

## 2016-06-11 ENCOUNTER — Other Ambulatory Visit: Payer: Self-pay

## 2016-06-11 ENCOUNTER — Encounter: Payer: Self-pay | Admitting: Anesthesiology

## 2016-06-11 ENCOUNTER — Encounter: Payer: Self-pay | Admitting: *Deleted

## 2016-06-15 ENCOUNTER — Ambulatory Visit: Payer: Commercial Managed Care - HMO | Admitting: Anesthesiology

## 2016-06-15 ENCOUNTER — Encounter
Admission: RE | Disposition: A | Discharge status: Home / Self Care | Payer: Self-pay | Source: Ambulatory Visit | Attending: Gastroenterology

## 2016-06-15 ENCOUNTER — Ambulatory Visit
Admission: RE | Admit: 2016-06-15 | Discharge: 2016-06-15 | Disposition: A | Discharge status: Home / Self Care | Payer: Commercial Managed Care - HMO | Source: Ambulatory Visit | Attending: Gastroenterology | Admitting: Gastroenterology

## 2016-06-15 DIAGNOSIS — K219 Gastro-esophageal reflux disease without esophagitis: Secondary | ICD-10-CM | POA: Diagnosis not present

## 2016-06-15 DIAGNOSIS — F419 Anxiety disorder, unspecified: Secondary | ICD-10-CM | POA: Insufficient documentation

## 2016-06-15 DIAGNOSIS — D120 Benign neoplasm of cecum: Secondary | ICD-10-CM

## 2016-06-15 DIAGNOSIS — K317 Polyp of stomach and duodenum: Secondary | ICD-10-CM | POA: Diagnosis not present

## 2016-06-15 DIAGNOSIS — K648 Other hemorrhoids: Secondary | ICD-10-CM | POA: Diagnosis not present

## 2016-06-15 DIAGNOSIS — D122 Benign neoplasm of ascending colon: Secondary | ICD-10-CM | POA: Diagnosis not present

## 2016-06-15 DIAGNOSIS — K635 Polyp of colon: Secondary | ICD-10-CM | POA: Diagnosis not present

## 2016-06-15 DIAGNOSIS — R12 Heartburn: Secondary | ICD-10-CM | POA: Insufficient documentation

## 2016-06-15 DIAGNOSIS — Z1211 Encounter for screening for malignant neoplasm of colon: Secondary | ICD-10-CM

## 2016-06-15 DIAGNOSIS — K2289 Other specified disease of esophagus: Secondary | ICD-10-CM

## 2016-06-15 DIAGNOSIS — K2970 Gastritis, unspecified, without bleeding: Secondary | ICD-10-CM | POA: Diagnosis not present

## 2016-06-15 DIAGNOSIS — K228 Other specified diseases of esophagus: Secondary | ICD-10-CM

## 2016-06-15 HISTORY — PX: ESOPHAGOGASTRODUODENOSCOPY (EGD) WITH PROPOFOL: SHX5813

## 2016-06-15 HISTORY — DX: Cardiac arrhythmia, unspecified: I49.9

## 2016-06-15 HISTORY — PX: POLYPECTOMY: SHX5525

## 2016-06-15 HISTORY — PX: COLONOSCOPY WITH PROPOFOL: SHX5780

## 2016-06-15 SURGERY — ESOPHAGOGASTRODUODENOSCOPY (EGD) WITH PROPOFOL
Anesthesia: Monitor Anesthesia Care | Wound class: Clean Contaminated

## 2016-06-15 SURGERY — COLONOSCOPY WITH PROPOFOL
Anesthesia: Choice

## 2016-06-15 MED ORDER — PROPOFOL 10 MG/ML IV BOLUS
INTRAVENOUS | Status: DC | PRN
  Administered 2016-06-15: 50 mg via INTRAVENOUS
  Administered 2016-06-15: 100 mg via INTRAVENOUS
  Administered 2016-06-15 (×3): 50 mg via INTRAVENOUS

## 2016-06-15 MED ORDER — LACTATED RINGERS IV SOLN
INTRAVENOUS | Status: DC | PRN
  Administered 2016-06-15: 08:00:00 via INTRAVENOUS

## 2016-06-15 MED ORDER — GLYCOPYRROLATE 0.2 MG/ML IJ SOLN
INTRAMUSCULAR | Status: DC | PRN
  Administered 2016-06-15: 0.2 mg via INTRAVENOUS

## 2016-06-15 MED ORDER — LIDOCAINE HCL (CARDIAC) 20 MG/ML IV SOLN
INTRAVENOUS | Status: DC | PRN
  Administered 2016-06-15: 40 mg via INTRAVENOUS

## 2016-06-15 MED ORDER — STERILE WATER FOR IRRIGATION IR SOLN
Status: DC | PRN
  Administered 2016-06-15: 08:00:00

## 2016-06-15 SURGICAL SUPPLY — 35 items

## 2016-06-15 NOTE — Anesthesia Postprocedure Evaluation (Signed)
Anesthesia Post Note  Patient: Connie Thomas  Procedure(s) Performed: Procedure(s) (LRB): ESOPHAGOGASTRODUODENOSCOPY (EGD) WITH PROPOFOL (N/A) COLONOSCOPY WITH PROPOFOL (N/A) POLYPECTOMY  Patient location during evaluation: PACU Anesthesia Type: MAC Level of consciousness: awake and alert and oriented Pain management: pain level controlled Vital Signs Assessment: post-procedure vital signs reviewed and stable Respiratory status: spontaneous breathing and nonlabored ventilation Cardiovascular status: stable Postop Assessment: no signs of nausea or vomiting and adequate PO intake Anesthetic complications: no    Estill Batten

## 2016-06-15 NOTE — Anesthesia Procedure Notes (Signed)
Procedure Name: MAC Date/Time: 06/15/2016 7:33 AM Performed by: Janna Arch Pre-anesthesia Checklist: Patient identified, Emergency Drugs available, Suction available and Patient being monitored Patient Re-evaluated:Patient Re-evaluated prior to inductionOxygen Delivery Method: Nasal cannula

## 2016-06-15 NOTE — Anesthesia Preprocedure Evaluation (Signed)
Anesthesia Evaluation  Patient identified by MRN, date of birth, ID band Patient awake    Reviewed: Allergy & Precautions, NPO status , Patient's Chart, lab work & pertinent test results  Airway Mallampati: II  TM Distance: >3 FB Neck ROM: Full    Dental no notable dental hx.    Pulmonary neg pulmonary ROS,    Pulmonary exam normal        Cardiovascular Normal cardiovascular exam+ dysrhythmias      Neuro/Psych PSYCHIATRIC DISORDERS Anxiety    GI/Hepatic Neg liver ROS, GERD  ,  Endo/Other  negative endocrine ROS  Renal/GU negative Renal ROS     Musculoskeletal negative musculoskeletal ROS (+)   Abdominal   Peds  Hematology negative hematology ROS (+)   Anesthesia Other Findings   Reproductive/Obstetrics                             Anesthesia Physical Anesthesia Plan  ASA: II  Anesthesia Plan: MAC   Post-op Pain Management:    Induction: Intravenous  Airway Management Planned:   Additional Equipment:   Intra-op Plan:   Post-operative Plan:   Informed Consent: I have reviewed the patients History and Physical, chart, labs and discussed the procedure including the risks, benefits and alternatives for the proposed anesthesia with the patient or authorized representative who has indicated his/her understanding and acceptance.     Plan Discussed with: CRNA  Anesthesia Plan Comments:         Anesthesia Quick Evaluation

## 2016-06-15 NOTE — Op Note (Signed)
[facility] Gastroenterology Patient Name: Connie Thomas Procedure Date: 06/15/2016 7:29 AM MRN: [id number] Account #: [id number] Date of Birth: [birth year] Admit Type: Outpatient Age: 61 Room: [facility] OR ROOM 01 Gender: Female Note Status: Finalized Procedure:            Upper GI endoscopy Indications:          Heartburn Providers:            Lucilla Lame MD, MD Referring MD:         Rusty Aus, MD (Referring MD) Medicines:            Propofol per Anesthesia Complications:        No immediate complications. Procedure:            Pre-Anesthesia Assessment:                       - Prior to the procedure, a History and Physical was                        performed, and patient medications and allergies were                        reviewed. The patient's tolerance of previous                        anesthesia was also reviewed. The risks and benefits of                        the procedure and the sedation options and risks were                        discussed with the patient. All questions were                        answered, and informed consent was obtained. Prior                        Anticoagulants: The patient has taken no previous                        anticoagulant or antiplatelet agents. ASA Grade                        Assessment: II - A patient with mild systemic disease.                        After reviewing the risks and benefits, the patient was                        deemed in satisfactory condition to undergo the                        procedure.                       After obtaining informed consent, the endoscope was                        passed under direct vision. Throughout the procedure,  the patient's blood pressure, pulse, and oxygen                        saturations were monitored continuously. The Olympus                        GIF H180J Endoscope [id number]) was introduced through                        the  mouth, and advanced to the second part of duodenum.                        The upper GI endoscopy was accomplished without                        difficulty. The patient tolerated the procedure well. Findings:      The Z-line was irregular and was found at the gastroesophageal junction.      Localized minimal inflammation characterized by erythema was found in       the gastric antrum. Biopsies were taken with a cold forceps for       histology.      The examined duodenum was normal. Impression:           - Z-line irregular, at the gastroesophageal junction.                       - Gastritis. Biopsied.                       - Normal examined duodenum. Recommendation:       - Await pathology results.                       - Discharge patient to home.                       - Resume previous diet.                       - Continue present medications.                       - Await pathology results.                       - Perform a colonoscopy. Procedure Code(s):    --- Professional ---                       [phone], Esophagogastroduodenoscopy, flexible, transoral;                        with biopsy, single or multiple Diagnosis Code(s):    --- Professional ---                       R12, Heartburn                       K22.8, Other specified diseases of esophagus                       K29.70, Gastritis, unspecified, without bleeding CPT copyright 2016 American Medical Association. All rights reserved. The codes documented in this report  are preliminary and upon coder review may  be revised to meet current compliance requirements. Lucilla Lame MD, MD 06/15/2016 7:42:31 AM This report has been signed electronically. Number of Addenda: 0 Note Initiated On: 06/15/2016 7:29 AM Total Procedure Duration: 0 hours 1 minute 28 seconds       [facility]

## 2016-06-15 NOTE — Transfer of Care (Signed)
Immediate Anesthesia Transfer of Care Note  Patient: Connie Thomas  Procedure(s) Performed: Procedure(s): ESOPHAGOGASTRODUODENOSCOPY (EGD) WITH PROPOFOL (N/A) COLONOSCOPY WITH PROPOFOL (N/A) POLYPECTOMY  Patient Location: PACU  Anesthesia Type: MAC  Level of Consciousness: awake, alert  and patient cooperative  Airway and Oxygen Therapy: Patient Spontanous Breathing and Patient connected to supplemental oxygen  Post-op Assessment: Post-op Vital signs reviewed, Patient's Cardiovascular Status Stable, Respiratory Function Stable, Patent Airway and No signs of Nausea or vomiting  Post-op Vital Signs: Reviewed and stable  Complications: No apparent anesthesia complications

## 2016-06-15 NOTE — H&P (Signed)
  Lucilla Lame, MD [address] [place], [place] [address] Phone: [phone] Fax : [phone]  Primary Care Physician:  Rusty Aus, MD Primary Gastroenterologist:  Dr. Allen Norris  Pre-Procedure History & Physical: HPI:  Connie Thomas is a 61 y.o. female is here for an endoscopy and colonoscopy.   Past Medical History:  Diagnosis Date  . Anxiety    Panic attacks  . Dysrhythmia   . Endometriosis   . GERD (gastroesophageal reflux disease)     Past Surgical History:  Procedure Laterality Date  . BLADDER SURGERY    . PELVIC LAPAROSCOPY      Prior to Admission medications   Medication Sig Start Date End Date Taking? Authorizing Provider  ALPRAZolam Duanne Moron) 0.25 MG tablet TAKE ONE TABLET BY MOUTH AT BEDTIME AS NEEDED FOR ANXIETY 03/21/15  Yes [name], NP  dexlansoprazole (DEXILANT) 60 MG capsule Take 60 mg by mouth daily.   Yes Historical Provider, MD  DULoxetine (CYMBALTA) 60 MG capsule Take 1 capsule (60 mg total) by mouth daily. 01/18/15  Yes [name], NP  metoprolol tartrate (LOPRESSOR) 25 MG tablet Take 25 mg by mouth daily.   Yes Historical Provider, MD  SENNA PO Take by mouth as needed.    Yes Historical Provider, MD    Allergies as of 04/13/2016  . (No Known Allergies)    Family History  Problem Relation Age of Onset  . Hypertension Mother   . Cancer Mother     Lung  . Stroke Mother   . Hypertension Father   . Stroke Father   . Cancer Father     Lung  . Uterine cancer Maternal Grandmother   . Cancer Maternal Grandfather     Colon    Social History   Social History  . Marital status: Married    Spouse name: N/A  . Number of children: N/A  . Years of education: N/A   Occupational History  . Not on file.   Social History Main Topics  . Smoking status: Never Smoker  . Smokeless tobacco: Never Used  . Alcohol use 1.8 oz/week    3 Standard drinks or equivalent per week  . Drug use: No  . Sexual activity: No   Other  Topics Concern  . Not on file   Social History Narrative  . No narrative on file    Review of Systems: See HPI, otherwise negative ROS  Physical Exam: BP 127/72   Pulse 86   Temp 97.5 F (36.4 C) (Temporal)   Ht 5\' 4"  (1.626 m)   Wt 155 lb (70.3 kg)   SpO2 99%   BMI 26.61 kg/m  General:   Alert,  pleasant and cooperative in NAD Head:  Normocephalic and atraumatic. Neck:  Supple; no masses or thyromegaly. Lungs:  Clear throughout to auscultation.    Heart:  Regular rate and rhythm. Abdomen:  Soft, nontender and nondistended. Normal bowel sounds, without guarding, and without rebound.   Neurologic:  Alert and  oriented x4;  grossly normal neurologically.  Impression/Plan: Connie Thomas is here for an endoscopy and colonoscopy to be performed for GERD and screening  Risks, benefits, limitations, and alternatives regarding  endoscopy and colonoscopy have been reviewed with the patient.  Questions have been answered.  All parties agreeable.   Lucilla Lame, MD  06/15/2016, 7:27 AM

## 2016-06-15 NOTE — Op Note (Signed)
[facility] Patient Name: Connie Thomas Procedure Date: 06/15/2016 7:29 AM MRN: [id number] Account #: [id number] Date of Birth: [birth year] Admit Type: Outpatient Age: 61 Room: [facility] OR ROOM 01 Gender: Female Note Status: Finalized Procedure:            Colonoscopy Indications:          Screening for colorectal malignant neoplasm Providers:            Lucilla Lame MD, MD Medicines:            Propofol per Anesthesia Complications:        No immediate complications. Procedure:            Pre-Anesthesia Assessment:                       - Prior to the procedure, a History and Physical was                        performed, and patient medications and allergies were                        reviewed. The patient's tolerance of previous                        anesthesia was also reviewed. The risks and benefits of                        the procedure and the sedation options and risks were                        discussed with the patient. All questions were                        answered, and informed consent was obtained. Prior                        Anticoagulants: The patient has taken no previous                        anticoagulant or antiplatelet agents. ASA Grade                        Assessment: II - A patient with mild systemic disease.                        After reviewing the risks and benefits, the patient was                        deemed in satisfactory condition to undergo the                        procedure.                       After obtaining informed consent, the colonoscope was                        passed under direct vision. Throughout the procedure,                        the patient's blood pressure,  pulse, and oxygen                        saturations were monitored continuously. The [place] (S#: [id number]) was introduced through                        the anus and advanced to the  the cecum, identified by                        appendiceal orifice and ileocecal valve. The                        colonoscopy was performed without difficulty. The                        patient tolerated the procedure well. The quality of                        the bowel preparation was excellent. Findings:      The perianal and digital rectal examinations were normal.      A 3 mm polyp was found in the cecum. The polyp was sessile. The polyp       was removed with a cold biopsy forceps. Resection and retrieval were       complete.      Two sessile polyps were found in the ascending colon. The polyps were 2       to 3 mm in size. These polyps were removed with a cold biopsy forceps.       Resection and retrieval were complete.      Non-bleeding internal hemorrhoids were found during retroflexion. The       hemorrhoids were Grade I (internal hemorrhoids that do not prolapse).      A diffuse area of moderate melanosis was found in the entire colon. Impression:           - One 3 mm polyp in the cecum, removed with a cold                        biopsy forceps. Resected and retrieved.                       - Two 2 to 3 mm polyps in the ascending colon, removed                        with a cold biopsy forceps. Resected and retrieved.                       - Non-bleeding internal hemorrhoids.                       - Melanosis in the colon. Recommendation:       - Discharge patient to home.                       - Resume previous diet.                       - Continue present medications.                       -  Repeat colonoscopy in 5 years if polyp adenoma and 10                        years if hyperplastic Procedure Code(s):    --- Professional ---                       [phone], Colonoscopy, flexible; with biopsy, single or                        multiple Diagnosis Code(s):    --- Professional ---                       Z12.11, Encounter for screening for malignant neoplasm                         of colon                       D12.0, Benign neoplasm of cecum                       D12.2, Benign neoplasm of ascending colon CPT copyright 2016 American Medical Association. All rights reserved. The codes documented in this report are preliminary and upon coder review may  be revised to meet current compliance requirements. Lucilla Lame MD, MD 06/15/2016 7:58:57 AM This report has been signed electronically. Number of Addenda: 0 Note Initiated On: 06/15/2016 7:29 AM Scope Withdrawal Time: 0 hours 7 minutes 37 seconds  Total Procedure Duration: 0 hours 11 minutes 27 seconds       [facility]

## 2016-06-18 ENCOUNTER — Encounter: Payer: Self-pay | Admitting: Gastroenterology

## 2016-06-19 DIAGNOSIS — I493 Ventricular premature depolarization: Secondary | ICD-10-CM | POA: Diagnosis not present

## 2016-06-20 ENCOUNTER — Encounter: Payer: Self-pay | Admitting: Gastroenterology

## 2016-06-21 ENCOUNTER — Encounter: Payer: Self-pay | Admitting: Gastroenterology

## 2016-08-29 DIAGNOSIS — M5010 Cervical disc disorder with radiculopathy, unspecified cervical region: Secondary | ICD-10-CM | POA: Diagnosis not present

## 2016-09-12 ENCOUNTER — Encounter: Payer: Self-pay | Admitting: Gynecology

## 2016-09-19 DIAGNOSIS — M1710 Unilateral primary osteoarthritis, unspecified knee: Secondary | ICD-10-CM | POA: Insufficient documentation

## 2016-09-19 DIAGNOSIS — M1712 Unilateral primary osteoarthritis, left knee: Secondary | ICD-10-CM | POA: Diagnosis not present

## 2016-09-19 DIAGNOSIS — M179 Osteoarthritis of knee, unspecified: Secondary | ICD-10-CM | POA: Insufficient documentation

## 2016-11-27 DIAGNOSIS — R319 Hematuria, unspecified: Secondary | ICD-10-CM | POA: Diagnosis not present

## 2016-11-27 DIAGNOSIS — R300 Dysuria: Secondary | ICD-10-CM | POA: Diagnosis not present

## 2016-11-27 DIAGNOSIS — N3001 Acute cystitis with hematuria: Secondary | ICD-10-CM | POA: Diagnosis not present

## 2017-01-15 DIAGNOSIS — I493 Ventricular premature depolarization: Secondary | ICD-10-CM | POA: Diagnosis not present

## 2017-01-22 DIAGNOSIS — E782 Mixed hyperlipidemia: Secondary | ICD-10-CM | POA: Insufficient documentation

## 2017-01-22 DIAGNOSIS — E538 Deficiency of other specified B group vitamins: Secondary | ICD-10-CM | POA: Diagnosis not present

## 2017-01-22 DIAGNOSIS — Z0000 Encounter for general adult medical examination without abnormal findings: Secondary | ICD-10-CM | POA: Diagnosis not present

## 2017-01-22 DIAGNOSIS — D369 Benign neoplasm, unspecified site: Secondary | ICD-10-CM | POA: Insufficient documentation

## 2017-01-22 DIAGNOSIS — R7989 Other specified abnormal findings of blood chemistry: Secondary | ICD-10-CM | POA: Insufficient documentation

## 2017-04-02 DIAGNOSIS — K219 Gastro-esophageal reflux disease without esophagitis: Secondary | ICD-10-CM | POA: Diagnosis not present

## 2017-05-29 ENCOUNTER — Ambulatory Visit: Payer: 59 | Admitting: General Surgery

## 2017-05-29 ENCOUNTER — Encounter: Payer: Self-pay | Admitting: General Surgery

## 2017-05-29 VITALS — BP 124/83 | HR 83 | Temp 97.6°F | Ht 64.0 in | Wt 164.6 lb

## 2017-05-29 DIAGNOSIS — K219 Gastro-esophageal reflux disease without esophagitis: Secondary | ICD-10-CM

## 2017-05-29 NOTE — Progress Notes (Signed)
Patient ID: Connie Thomas, female   DOB: December 24, 1955, 62 y.o.   MRN: [id number]  CC: Chest pain  HPI UNNAMED Connie Thomas is a 62 y.o. female who presents to clinic today for evaluation of chest discomfort and uncontrolled acid reflux.  Her outpatient referral was requested by Dr. Tami Ribas for evaluation of possible hiatal hernia.  Patient reports she is got a long-standing history of acid reflux that has been treated with numerous different acid suppressing medications.  It worked fine until approximately 4 months ago.  4 months ago she started having chest discomfort as well as an acid taste in her mouth with a chronic cough thought to be secondary to acid reflux.  Her medication was changed without any improvement.  She denies any fevers, chills, nausea, vomiting.  However she states she has alternating diarrhea and constipation.  She states that nothing seems to affect the chest discomfort.  It is however worse in the morning when she awakens.  What she eats or drinks does not make a difference.  HPI  Past Medical History:  Diagnosis Date  . Adjustment disorder with mixed anxiety and depressed mood 01/13/2014  . Anxiety    Panic attacks  . Benign neoplasm of ascending colon   . Dysrhythmia   . Endometriosis   . Female rectocele without uterine prolapse 01/13/2014  . Gastroesophageal reflux disease   . GERD (gastroesophageal reflux disease)   . Other specified diseases of esophagus   . Symptomatic PVCs 05/08/2016   Overview:  ETT/echo neg 1/18    Past Surgical History:  Procedure Laterality Date  . BLADDER SURGERY    . COLONOSCOPY WITH PROPOFOL N/A 06/15/2016   Procedure: COLONOSCOPY WITH PROPOFOL;  Surgeon: Lucilla Lame, MD;  Location: [place];  Service: Endoscopy;  Laterality: N/A;  . ESOPHAGOGASTRODUODENOSCOPY (EGD) WITH PROPOFOL N/A 06/15/2016   Procedure: ESOPHAGOGASTRODUODENOSCOPY (EGD) WITH PROPOFOL;  Surgeon: Lucilla Lame, MD;  Location: [place];  Service:  Endoscopy;  Laterality: N/A;  . PELVIC LAPAROSCOPY    . POLYPECTOMY  06/15/2016   Procedure: POLYPECTOMY;  Surgeon: Lucilla Lame, MD;  Location: [place];  Service: Endoscopy;;    Family History  Problem Relation Age of Onset  . Hypertension Mother   . Cancer Mother        Lung  . Stroke Mother   . Hypertension Father   . Stroke Father   . Cancer Father        Lung  . Uterine cancer Maternal Grandmother   . Cancer Maternal Grandfather        Colon    Social History Social History   Tobacco Use  . Smoking status: Never Smoker  . Smokeless tobacco: Never Used  Substance Use Topics  . Alcohol use: Yes    Alcohol/week: 1.8 oz    Types: 3 Standard drinks or equivalent per week  . Drug use: No    Allergies  Allergen Reactions  . Other     Current Outpatient Medications  Medication Sig Dispense Refill  . ALPRAZolam (XANAX) 0.25 MG tablet TAKE ONE TABLET BY MOUTH AT BEDTIME AS NEEDED FOR ANXIETY 30 tablet 1  . dexlansoprazole (DEXILANT) 60 MG capsule Take 60 mg by mouth daily.    . DULoxetine (CYMBALTA) 60 MG capsule Take 1 capsule (60 mg total) by mouth daily. 90 capsule 4  . magnesium oxide (MAG-OX) 400 MG tablet Take by mouth.    . metoprolol tartrate (LOPRESSOR) 25 MG tablet Take 25 mg by mouth  daily.    . SENNA PO Take by mouth as needed.      No current facility-administered medications for this visit.      Review of Systems A multi-point review of systems was asked and was negative except for the findings documented in the HPI  Physical Exam Blood pressure 124/83, pulse 83, temperature 97.6 F (36.4 C), temperature source Oral, height 5\' 4"  (1.626 m), weight 74.7 kg (164 lb 9.6 oz). CONSTITUTIONAL: No acute distress. EYES: Pupils are equal, round, and reactive to light, Sclera are non-icteric. EARS, NOSE, MOUTH AND THROAT: The oropharynx is clear. The oral mucosa is pink and moist. Hearing is intact to voice. LYMPH NODES:  Lymph nodes in the neck  are normal. RESPIRATORY:  Lungs are clear. There is normal respiratory effort, with equal breath sounds bilaterally, and without pathologic use of accessory muscles.  gastric sounds are auscultated in the mid chest. CARDIOVASCULAR: Heart is regular without murmurs, gallops, or rubs. GI: The abdomen is  soft, nontender, and nondistended. There are no palpable masses. There is no hepatosplenomegaly. There are normal bowel sounds in all quadrants. GU: Rectal deferred.   MUSCULOSKELETAL: Normal muscle strength and tone. No cyanosis or edema.   SKIN: Turgor is good and there are no pathologic skin lesions or ulcers. NEUROLOGIC: Motor and sensation is grossly normal. Cranial nerves are grossly intact. PSYCH:  Oriented to person, place and time. Affect is normal.  Data Reviewed There are no recent images or labs.  Her most recent upper endoscopy was almost 12 months ago.  It showed an abnormal Z line but no evidence of hiatal hernia or esophagitis. I have personally reviewed the patient's imaging, laboratory findings and medical records.    Assessment    Chest discomfort    Plan    62 year old female sent for evaluation of a possible hiatal hernia.  Her history and physical are suggestive of a hiatal hernia but discussed with the patient that we have no image findings of hernia or endoscopic evidence of esophagitis.  Given this discussed that the workup includes first a barium swallow to prove whether or not she has a hiatal hernia.  Following that is esophageal pH probe to prove whether or not she is refluxing acid into her esophagus.  This is also in combination with esophageal manometry to document appropriate function of her esophagus.  After this workup is completed then we would discuss the findings and whether or not she is a candidate for antireflux surgery and repair of a hiatal hernia.  Also discussed with the patient that I have a pending departure from the group.  She would be introduced to  1 of my partners who also performs a surgery at her follow-up.     Time spent with the patient was 102minutes, with more than 50% of the time spent in face-to-face education, counseling and care coordination.     Clayburn Pert, MD FACS General Surgeon 05/29/2017, 3:49 PM

## 2017-05-29 NOTE — Patient Instructions (Addendum)
We have scheduled your Esophageal Manometry with [place] study 06/03/17 @ 8:30 am [place] [facility] center.  We have scheduled your Swallow study 06/06/17 @ 8:15 am at [facility]. Please do not have nothing to eat or drink after midnight the night prior to having this test done.   Please see your follow up appointment listed below.

## 2017-05-30 ENCOUNTER — Other Ambulatory Visit: Payer: Self-pay

## 2017-05-30 NOTE — Addendum Note (Signed)
Addended by: Wayna Chalet on: 05/30/2017 08:50 AM   Modules accepted: Orders

## 2017-06-04 ENCOUNTER — Encounter
Admission: RE | Disposition: A | Discharge status: Home / Self Care | Payer: Self-pay | Source: Ambulatory Visit | Attending: Gastroenterology

## 2017-06-04 ENCOUNTER — Ambulatory Visit
Admission: RE | Admit: 2017-06-04 | Discharge: 2017-06-04 | Disposition: A | Discharge status: Home / Self Care | Payer: 59 | Source: Ambulatory Visit | Attending: Gastroenterology | Admitting: Gastroenterology

## 2017-06-04 ENCOUNTER — Telehealth: Payer: Self-pay

## 2017-06-04 SURGERY — MANOMETRY, ESOPHAGUS

## 2017-06-04 MED ORDER — BUTAMBEN-TETRACAINE-BENZOCAINE 2-2-14 % EX AERO: 1.0000 | INHALATION_SPRAY | Freq: Once | CUTANEOUS | Status: DC

## 2017-06-04 MED ORDER — BUTAMBEN-TETRACAINE-BENZOCAINE 2-2-14 % EX AERO
INHALATION_SPRAY | CUTANEOUS | Status: AC
  Filled 2017-06-04: qty 5

## 2017-06-04 MED ORDER — LIDOCAINE HCL 2 % EX GEL: 1.0000 "application " | Freq: Once | CUTANEOUS | Status: DC

## 2017-06-04 MED ORDER — LIDOCAINE HCL 2 % EX GEL
CUTANEOUS | Status: AC
  Filled 2017-06-04: qty 5

## 2017-06-04 NOTE — Telephone Encounter (Signed)
Trish from Endoscopy called to confirm Esophageal manometry with PH study today.  Confirmed.

## 2017-06-04 NOTE — Telephone Encounter (Signed)
After multiple attempts the probe would not pass through per Trish in Endoscopy.

## 2017-06-05 ENCOUNTER — Telehealth: Payer: Self-pay | Admitting: General Practice

## 2017-06-05 NOTE — Telephone Encounter (Signed)
Patient instructed to check in at Registration and they will show her where to go from there.  Patient verbalized understanding.

## 2017-06-05 NOTE — Telephone Encounter (Signed)
Patients calling said she went for her test yesterday, but they were not able to do the test, and she goes for her swallow test tomorrow but isn't sure where she needs to go for this test. Please call patient and advise.

## 2017-06-06 ENCOUNTER — Ambulatory Visit
Admission: RE | Admit: 2017-06-06 | Discharge: 2017-06-06 | Disposition: A | Discharge status: Home / Self Care | Payer: 59 | Source: Ambulatory Visit | Attending: General Surgery | Admitting: General Surgery

## 2017-06-06 DIAGNOSIS — K219 Gastro-esophageal reflux disease without esophagitis: Secondary | ICD-10-CM

## 2017-06-06 DIAGNOSIS — K449 Diaphragmatic hernia without obstruction or gangrene: Secondary | ICD-10-CM | POA: Insufficient documentation

## 2017-06-13 ENCOUNTER — Telehealth: Payer: Self-pay | Admitting: General Practice

## 2017-06-13 NOTE — Telephone Encounter (Signed)
Patient called asking for you to give her a call when you have a chance. Patient can be reached at [phone]. Please call and advise.

## 2017-06-17 NOTE — Telephone Encounter (Signed)
Left message for patient to return call.

## 2017-06-17 NOTE — Telephone Encounter (Signed)
Patients questions were answered at this time.

## 2017-06-19 ENCOUNTER — Encounter: Payer: Self-pay | Admitting: General Surgery

## 2017-06-19 ENCOUNTER — Ambulatory Visit: Payer: 59 | Admitting: General Surgery

## 2017-06-19 VITALS — BP 152/86 | HR 80 | Temp 98.0°F | Ht 64.0 in | Wt 166.0 lb

## 2017-06-19 DIAGNOSIS — K2100 Gastro-esophageal reflux disease with esophagitis, without bleeding: Secondary | ICD-10-CM

## 2017-06-19 NOTE — Patient Instructions (Signed)
We will refer you to [facility] and they will be contacting you in the next 2 weeks. If you do not hear from them after two weeks, please give [place] a call for [place] to check on your referral.

## 2017-06-19 NOTE — Progress Notes (Signed)
Outpatient Surgical Follow Up  06/19/2017  Connie Thomas is an 62 y.o. female.   Chief Complaint  Patient presents with  . Follow-up    Possible Hiatal Hernia- Discuss Swallow study and Manometry results    HPI: 62 year old female returns to clinic for follow-up from her reflux studies.  She was able to have a barium swallow which showed a small hiatal hernia.  She was unable to have the manometry or the pH study secondary to inability to pass the probe.  She reports her symptoms have not changed since her last visit.  Past Medical History:  Diagnosis Date  . Adjustment disorder with mixed anxiety and depressed mood 01/13/2014  . Anxiety    Panic attacks  . Benign neoplasm of ascending colon   . Dysrhythmia   . Endometriosis   . Female rectocele without uterine prolapse 01/13/2014  . Gastroesophageal reflux disease   . GERD (gastroesophageal reflux disease)   . Other specified diseases of esophagus   . Symptomatic PVCs 05/08/2016   Overview:  ETT/echo neg 1/18    Past Surgical History:  Procedure Laterality Date  . BLADDER SURGERY    . COLONOSCOPY WITH PROPOFOL N/A 06/15/2016   Procedure: COLONOSCOPY WITH PROPOFOL;  Surgeon: Lucilla Lame, MD;  Location: [place];  Service: Endoscopy;  Laterality: N/A;  . ESOPHAGOGASTRODUODENOSCOPY (EGD) WITH PROPOFOL N/A 06/15/2016   Procedure: ESOPHAGOGASTRODUODENOSCOPY (EGD) WITH PROPOFOL;  Surgeon: Lucilla Lame, MD;  Location: [place];  Service: Endoscopy;  Laterality: N/A;  . PELVIC LAPAROSCOPY    . POLYPECTOMY  06/15/2016   Procedure: POLYPECTOMY;  Surgeon: Lucilla Lame, MD;  Location: [facility];  Service: Endoscopy;;    Family History  Problem Relation Age of Onset  . Hypertension Mother   . Cancer Mother        Lung  . Stroke Mother   . Hypertension Father   . Stroke Father   . Cancer Father        Lung  . Uterine cancer Maternal Grandmother   . Cancer Maternal Grandfather        Colon     Social History:  reports that  has never smoked. she has never used smokeless tobacco. She reports that she drinks about 1.8 oz of alcohol per week. She reports that she does not use drugs.  Allergies:  Allergies  Allergen Reactions  . Other     Medications reviewed.    ROS None accomplished   BP (!) 152/86   Pulse 80   Temp 98 F (36.7 C) (Oral)   Ht 5\' 4"  (1.626 m)   Wt 75.3 kg (166 lb)   BMI 28.49 kg/m   Physical Exam  General: No acute distress Abdomen: Soft nontender   No results found for this or any previous visit (from the past 48 hour(s)). No results found.  Assessment/Plan:  1. Chronic reflux esophagitis 62 year old female with chronic reflux disease.  Unable to complete the workup locally.  Plan to refer to [facility] to complete the workup for whether or not she is a candidate for antireflux surgery.  Patient voiced understanding and agrees with this plan.  Follow-up as needed.     Clayburn Pert, MD FACS General Surgeon  06/19/2017,12:59 PM

## 2017-06-21 ENCOUNTER — Telehealth: Payer: Self-pay | Admitting: General Surgery

## 2017-06-21 NOTE — Telephone Encounter (Signed)
Referral was faxed to [facility] specialty surgery. All clinic notes, demographics, pathology, procedures were faxed to [phone].  Reason for referral:small hiatal hernia-unable to complete manometry and ph study due to inability to pass probe. complete the workup for whether or not she is a candidate for antireflux surgery.  [facility] stated that the records will be reviewed within 48-72 hours and they will call patient with an appointment.  I will follow up to make sure this is scheduled.

## 2017-06-26 NOTE — Telephone Encounter (Signed)
Appointment has been made and patient is aware.   Appointment-07/19/17 @ 11:00am with Dr Cammie Sickle with [facility] GI surgery.   Clinic number: [phone] Referred by Dr Adonis Huguenin.

## 2017-07-06 DIAGNOSIS — J1189 Influenza due to unidentified influenza virus with other manifestations: Secondary | ICD-10-CM | POA: Diagnosis not present

## 2017-07-19 DIAGNOSIS — K449 Diaphragmatic hernia without obstruction or gangrene: Secondary | ICD-10-CM | POA: Diagnosis not present

## 2017-09-26 DIAGNOSIS — K3189 Other diseases of stomach and duodenum: Secondary | ICD-10-CM | POA: Diagnosis not present

## 2017-09-26 DIAGNOSIS — K219 Gastro-esophageal reflux disease without esophagitis: Secondary | ICD-10-CM | POA: Diagnosis not present

## 2017-09-26 DIAGNOSIS — K317 Polyp of stomach and duodenum: Secondary | ICD-10-CM | POA: Diagnosis not present

## 2017-09-26 DIAGNOSIS — R1314 Dysphagia, pharyngoesophageal phase: Secondary | ICD-10-CM | POA: Diagnosis not present

## 2017-09-26 DIAGNOSIS — K449 Diaphragmatic hernia without obstruction or gangrene: Secondary | ICD-10-CM | POA: Diagnosis not present

## 2017-09-26 DIAGNOSIS — R1319 Other dysphagia: Secondary | ICD-10-CM | POA: Diagnosis not present

## 2017-09-26 DIAGNOSIS — K319 Disease of stomach and duodenum, unspecified: Secondary | ICD-10-CM | POA: Diagnosis not present

## 2017-10-10 DIAGNOSIS — I493 Ventricular premature depolarization: Secondary | ICD-10-CM | POA: Diagnosis not present

## 2017-10-28 DIAGNOSIS — K219 Gastro-esophageal reflux disease without esophagitis: Secondary | ICD-10-CM | POA: Diagnosis not present

## 2017-11-19 DIAGNOSIS — K219 Gastro-esophageal reflux disease without esophagitis: Secondary | ICD-10-CM | POA: Diagnosis not present

## 2017-11-25 DIAGNOSIS — D2262 Melanocytic nevi of left upper limb, including shoulder: Secondary | ICD-10-CM | POA: Diagnosis not present

## 2017-11-25 DIAGNOSIS — D2271 Melanocytic nevi of right lower limb, including hip: Secondary | ICD-10-CM | POA: Diagnosis not present

## 2017-11-25 DIAGNOSIS — D2261 Melanocytic nevi of right upper limb, including shoulder: Secondary | ICD-10-CM | POA: Diagnosis not present

## 2017-12-16 DIAGNOSIS — K219 Gastro-esophageal reflux disease without esophagitis: Secondary | ICD-10-CM | POA: Diagnosis not present

## 2017-12-16 DIAGNOSIS — K598 Other specified functional intestinal disorders: Secondary | ICD-10-CM | POA: Diagnosis not present

## 2018-01-27 ENCOUNTER — Other Ambulatory Visit: Payer: Self-pay | Admitting: Internal Medicine

## 2018-01-27 DIAGNOSIS — Z0000 Encounter for general adult medical examination without abnormal findings: Secondary | ICD-10-CM | POA: Diagnosis not present

## 2018-01-27 DIAGNOSIS — E538 Deficiency of other specified B group vitamins: Secondary | ICD-10-CM | POA: Diagnosis not present

## 2018-01-27 DIAGNOSIS — R7989 Other specified abnormal findings of blood chemistry: Secondary | ICD-10-CM | POA: Diagnosis not present

## 2018-01-27 DIAGNOSIS — Z1231 Encounter for screening mammogram for malignant neoplasm of breast: Secondary | ICD-10-CM

## 2018-03-03 DIAGNOSIS — H43811 Vitreous degeneration, right eye: Secondary | ICD-10-CM | POA: Diagnosis not present

## 2018-12-22 ENCOUNTER — Encounter: Payer: Self-pay | Admitting: Urology

## 2018-12-22 ENCOUNTER — Ambulatory Visit: Payer: 59 | Admitting: Urology

## 2018-12-22 ENCOUNTER — Other Ambulatory Visit: Payer: Self-pay

## 2018-12-22 VITALS — BP 156/83 | HR 86 | Ht 64.0 in | Wt 167.0 lb

## 2018-12-22 DIAGNOSIS — R3915 Urgency of urination: Secondary | ICD-10-CM | POA: Diagnosis not present

## 2018-12-22 DIAGNOSIS — R310 Gross hematuria: Secondary | ICD-10-CM

## 2018-12-22 NOTE — Progress Notes (Signed)
12/22/2018 3:29 PM   Connie Thomas [birth year] [id number]  Referring provider: Rusty Aus, MD [place] [facility] [place],  [place] [address]  Chief Complaint  Patient presents with  . Hematuria    New patient    HPI: Connie Thomas is a 63 year old female who over 4 July weekend had intense urinary urgency and passed 2 small blood clots.  She [place] and urinalysis did show blood.  It was felt she had a UTI and she was started on Keflex however a urine culture was performed which was subsequently negative.  She was instructed to discontinue her antibiotics.  Her symptoms resolved within 48 hours and she has been asymptomatic since that time.  She has a prior history of stress incontinence and I did a pubovaginal sling in the mid-late 1990s.  She denies flank, abdominal or pelvic pain.   PMH: Past Medical History:  Diagnosis Date  . Adjustment disorder with mixed anxiety and depressed mood 01/13/2014  . Anxiety    Panic attacks  . Benign neoplasm of ascending colon   . Dysrhythmia   . Endometriosis   . Female rectocele without uterine prolapse 01/13/2014  . Gastroesophageal reflux disease   . GERD (gastroesophageal reflux disease)   . Other specified diseases of esophagus   . Symptomatic PVCs 05/08/2016   Overview:  ETT/echo neg 1/18    Surgical History: Past Surgical History:  Procedure Laterality Date  . BLADDER SURGERY    . COLONOSCOPY WITH PROPOFOL N/A 06/15/2016   Procedure: COLONOSCOPY WITH PROPOFOL;  Surgeon: Lucilla Lame, MD;  Location: [place];  Service: Endoscopy;  Laterality: N/A;  . ESOPHAGOGASTRODUODENOSCOPY (EGD) WITH PROPOFOL N/A 06/15/2016   Procedure: ESOPHAGOGASTRODUODENOSCOPY (EGD) WITH PROPOFOL;  Surgeon: Lucilla Lame, MD;  Location: [place];  Service: Endoscopy;  Laterality: N/A;  . PELVIC LAPAROSCOPY    . POLYPECTOMY  06/15/2016   Procedure: POLYPECTOMY;   Surgeon: Lucilla Lame, MD;  Location: [place];  Service: Endoscopy;;    Home Medications:  Allergies as of 12/22/2018      Reactions   Other       Medication List       Accurate as of December 22, 2018  3:29 PM. If you have any questions, ask your nurse or doctor.        ALPRAZolam 0.25 MG tablet Commonly known as: XANAX TAKE ONE TABLET BY MOUTH AT BEDTIME AS NEEDED FOR ANXIETY   Dexilant 60 MG capsule Generic drug: dexlansoprazole Take 60 mg by mouth daily.   DULoxetine 60 MG capsule Commonly known as: Cymbalta Take 1 capsule (60 mg total) by mouth daily.   metoprolol tartrate 25 MG tablet Commonly known as: LOPRESSOR Take 25 mg by mouth daily.   omeprazole 40 MG capsule Commonly known as: PRILOSEC   SENNA PO Take by mouth as needed.   vitamin C 1000 MG tablet Take 1,000 mg by mouth daily.       Allergies:  Allergies  Allergen Reactions  . Other     Family History: Family History  Problem Relation Age of Onset  . Hypertension Mother   . Cancer Mother        Lung  . Stroke Mother   . Hypertension Father   . Stroke Father   . Cancer Father        Lung  . Uterine cancer Maternal Grandmother   . Cancer Maternal Grandfather  Colon    Social History:  reports that she has never smoked. She has never used smokeless tobacco. She reports current alcohol use of about 3.0 standard drinks of alcohol per week. She reports that she does not use drugs.  ROS: UROLOGY Frequent Urination?: No Hard to postpone urination?: No Burning/pain with urination?: No Get up at night to urinate?: No Leakage of urine?: No Urine stream starts and stops?: No Trouble starting stream?: No Do you have to strain to urinate?: No Blood in urine?: Yes Urinary tract infection?: No Sexually transmitted disease?: No Injury to kidneys or bladder?: No Painful intercourse?: No Weak stream?: No Currently pregnant?: No Vaginal bleeding?: No Last menstrual period?:  n  Gastrointestinal Nausea?: No Vomiting?: No Indigestion/heartburn?: Yes Diarrhea?: No Constipation?: No  Constitutional Fever: No Night sweats?: No Weight loss?: No Fatigue?: No  Skin Skin rash/lesions?: No Itching?: No  Eyes Blurred vision?: No Double vision?: No  Ears/Nose/Throat Sore throat?: No Sinus problems?: No  Hematologic/Lymphatic Swollen glands?: No Easy bruising?: No  Cardiovascular Leg swelling?: No Chest pain?: No  Respiratory Cough?: No Shortness of breath?: No  Endocrine Excessive thirst?: No  Musculoskeletal Back pain?: No Joint pain?: No  Neurological Headaches?: No Dizziness?: No  Psychologic Depression?: No Anxiety?: No  Physical Exam: BP (!) 156/83   Pulse 86   Ht 5\' 4"  (1.626 m)   Wt 167 lb (75.8 kg)   BMI 28.67 kg/m   Constitutional:  Alert and oriented, No acute distress. HEENT: [place] AT, moist mucus membranes.  Trachea midline, no masses. Cardiovascular: No clubbing, cyanosis, or edema. Respiratory: Normal respiratory effort, no increased work of breathing. Skin: No rashes, bruises or suspicious lesions. Neurologic: Grossly intact, no focal deficits, moving all 4 extremities. Psychiatric: Normal mood and affect.  Laboratory Data:  Urinalysis Dipstick/microscopy negative  Assessment & Plan:    - Gross hematuria Resolved.  Urinalysis today is clear.  She may have had a distal ureteral calculus accounting for her symptoms and hematuria which has passed.  We discussed the standard evaluation for high risk hematuria and I recommended scheduling CT urogram and cystoscopy.  Serum creatinine was ordered.  She desires to proceed.   - Urinary urgency Resolved   Abbie Sons, MD  [facility] [address], [place], [place] [address] [phone]

## 2018-12-23 ENCOUNTER — Ambulatory Visit: Payer: 59 | Admitting: Podiatry

## 2018-12-23 ENCOUNTER — Encounter: Payer: Self-pay | Admitting: Urology

## 2018-12-23 ENCOUNTER — Encounter: Payer: Self-pay | Admitting: Podiatry

## 2018-12-23 ENCOUNTER — Ambulatory Visit (INDEPENDENT_AMBULATORY_CARE_PROVIDER_SITE_OTHER): Payer: 59

## 2018-12-23 ENCOUNTER — Other Ambulatory Visit: Payer: Self-pay | Admitting: Podiatry

## 2018-12-23 VITALS — Temp 97.1°F

## 2018-12-23 DIAGNOSIS — M779 Enthesopathy, unspecified: Secondary | ICD-10-CM | POA: Diagnosis not present

## 2018-12-23 DIAGNOSIS — M778 Other enthesopathies, not elsewhere classified: Secondary | ICD-10-CM

## 2018-12-23 DIAGNOSIS — M7752 Other enthesopathy of left foot: Secondary | ICD-10-CM

## 2018-12-23 LAB — MICROSCOPIC EXAMINATION
Bacteria, UA: NONE SEEN
RBC: NONE SEEN /hpf (ref 0–2)
WBC, UA: NONE SEEN /hpf (ref 0–5)

## 2018-12-23 LAB — URINALYSIS, COMPLETE
Bilirubin, UA: NEGATIVE
Glucose, UA: NEGATIVE
Ketones, UA: NEGATIVE
Leukocytes,UA: NEGATIVE
Nitrite, UA: NEGATIVE
Protein,UA: NEGATIVE
RBC, UA: NEGATIVE
Specific Gravity, UA: 1.01 (ref 1.005–1.030)
Urobilinogen, Ur: 0.2 mg/dL (ref 0.2–1.0)
pH, UA: 7 (ref 5.0–7.5)

## 2018-12-23 LAB — CREATININE, SERUM
Creatinine, Ser: 0.68 mg/dL (ref 0.57–1.00)
GFR calc Af Amer: 108 mL/min/{1.73_m2} (ref >59)
GFR calc non Af Amer: 93 mL/min/{1.73_m2} (ref >59)

## 2018-12-23 MED ORDER — METHYLPREDNISOLONE 4 MG PO TBPK: ORAL_TABLET | ORAL | 0 refills | Status: DC

## 2018-12-23 MED ORDER — MELOXICAM 15 MG PO TABS: 15.0000 mg | ORAL_TABLET | Freq: Every day | ORAL | 1 refills | Status: AC

## 2018-12-25 NOTE — Progress Notes (Signed)
   HPI: 63 y.o. female presenting today as a new patient with a chief complaint of constant pain located to the lateral left foot that began 1-2 months ago. She states it feels as if the muscle is stretched. She reports associated swelling. Walking increases the pain. She has not done anything for treatment. She denies any known trauma or injury. Patient is here for further evaluation and treatment.   Past Medical History:  Diagnosis Date  . Adjustment disorder with mixed anxiety and depressed mood 01/13/2014  . Anxiety    Panic attacks  . Benign neoplasm of ascending colon   . Dysrhythmia   . Endometriosis   . Female rectocele without uterine prolapse 01/13/2014  . Gastroesophageal reflux disease   . GERD (gastroesophageal reflux disease)   . Other specified diseases of esophagus   . Symptomatic PVCs 05/08/2016   Overview:  ETT/echo neg 1/18     Physical Exam: General: The patient is alert and oriented x3 in no acute distress.  Dermatology: Skin is warm, dry and supple bilateral lower extremities. Negative for open lesions or macerations.  Vascular: Palpable pedal pulses bilaterally. No edema or erythema noted. Capillary refill within normal limits.  Neurological: Epicritic and protective threshold grossly intact bilaterally.   Musculoskeletal Exam: Range of motion within normal limits to all pedal and ankle joints bilateral. Muscle strength 5/5 in all groups bilateral.   Radiographic Exam:  Normal osseous mineralization. Joint spaces preserved. No fracture/dislocation/boney destruction.    Assessment: 1. Left lateral midfoot capsulitis    Plan of Care:  1. Patient evaluated. X-Rays reviewed.  2. Injection of 0.5 mLs Celestone Soluspan injected into the lateral left midfoot.  3. Prescription for Medrol Dose Pak provided to patient. 4. Prescription for Meloxicam provided to patient. 5. CAM boot dispensed.  6. Return to clinic in 4 weeks.   Going on an RV trip 2nd week of  September.      Edrick Kins, DPM [facility]  Dr. Edrick Kins, DPM    [address], [place] [address]                Office [phone]  Fax [phone]

## 2019-01-06 ENCOUNTER — Other Ambulatory Visit: Payer: Self-pay | Admitting: Urology

## 2019-01-06 DIAGNOSIS — R310 Gross hematuria: Secondary | ICD-10-CM

## 2019-01-06 NOTE — Telephone Encounter (Signed)
I am going to need a new order for this patient. She will have to go to [facility] due to [facility] being out of network and since it has already been scheduled they will need a new order. Can you put one in for me please?   Thanks, Sharyn Lull

## 2019-01-09 ENCOUNTER — Ambulatory Visit: Payer: 59

## 2019-01-19 ENCOUNTER — Other Ambulatory Visit: Payer: 59 | Admitting: Urology

## 2019-01-19 ENCOUNTER — Ambulatory Visit
Admission: RE | Admit: 2019-01-19 | Discharge: 2019-01-19 | Disposition: A | Discharge status: Home / Self Care | Payer: 59 | Source: Ambulatory Visit | Attending: Urology | Admitting: Urology

## 2019-01-19 DIAGNOSIS — R310 Gross hematuria: Secondary | ICD-10-CM

## 2019-01-19 MED ORDER — IOPAMIDOL (ISOVUE-300) INJECTION 61%
125.0000 mL | Freq: Once | INTRAVENOUS | Status: AC | PRN
  Administered 2019-01-19: 125 mL via INTRAVENOUS

## 2019-01-27 ENCOUNTER — Other Ambulatory Visit: Payer: Self-pay

## 2019-01-27 ENCOUNTER — Encounter: Payer: Self-pay | Admitting: Podiatry

## 2019-01-27 ENCOUNTER — Ambulatory Visit: Payer: 59 | Admitting: Podiatry

## 2019-01-27 DIAGNOSIS — M778 Other enthesopathies, not elsewhere classified: Secondary | ICD-10-CM

## 2019-01-27 DIAGNOSIS — M779 Enthesopathy, unspecified: Secondary | ICD-10-CM | POA: Diagnosis not present

## 2019-01-27 NOTE — Progress Notes (Signed)
   HPI: 63 y.o. female presenting today for follow up evaluation of left foot pain. She states she is doing much better and has improved significantly. She reports a mild intermittent "twinge" of pain on the lateral left foot that began yesterday. She has been taking Meloxicam and using the CAM boot as directed. There are no worsening factors noted at this time. Patient is here for further evaluation and treatment.   Past Medical History:  Diagnosis Date  . Adjustment disorder with mixed anxiety and depressed mood 01/13/2014  . Anxiety    Panic attacks  . Benign neoplasm of ascending colon   . Dysrhythmia   . Endometriosis   . Female rectocele without uterine prolapse 01/13/2014  . Gastroesophageal reflux disease   . GERD (gastroesophageal reflux disease)   . Other specified diseases of esophagus   . Symptomatic PVCs 05/08/2016   Overview:  ETT/echo neg 1/18     Physical Exam: General: The patient is alert and oriented x3 in no acute distress.  Dermatology: Skin is warm, dry and supple bilateral lower extremities. Negative for open lesions or macerations.  Vascular: Palpable pedal pulses bilaterally. No edema or erythema noted. Capillary refill within normal limits.  Neurological: Epicritic and protective threshold grossly intact bilaterally.   Musculoskeletal Exam: Range of motion within normal limits to all pedal and ankle joints bilateral. Muscle strength 5/5 in all groups bilateral.   Assessment: 1. Left lateral midfoot capsulitis - improved    Plan of Care:  1. Patient evaluated.  2. Recommended good shoe gear. Discontinue using CAM boot.  3. Continue taking Meloxicam as needed.  4. Return to clinic as needed.      Edrick Kins, DPM [facility]  Dr. Edrick Kins, DPM    [address], [place] [address]                Office [phone]  Fax [phone]

## 2019-01-28 ENCOUNTER — Ambulatory Visit: Payer: 59 | Admitting: Urology

## 2019-01-28 ENCOUNTER — Encounter: Payer: Self-pay | Admitting: Urology

## 2019-01-28 VITALS — BP 135/82 | HR 81 | Ht 64.0 in | Wt 167.0 lb

## 2019-01-28 DIAGNOSIS — R310 Gross hematuria: Secondary | ICD-10-CM

## 2019-01-28 LAB — MICROSCOPIC EXAMINATION
Bacteria, UA: NONE SEEN
RBC: NONE SEEN /hpf (ref 0–2)

## 2019-01-28 LAB — URINALYSIS, COMPLETE
Bilirubin, UA: NEGATIVE
Glucose, UA: NEGATIVE
Ketones, UA: NEGATIVE
Nitrite, UA: NEGATIVE
Protein,UA: NEGATIVE
RBC, UA: NEGATIVE
Specific Gravity, UA: 1.015 (ref 1.005–1.030)
Urobilinogen, Ur: 0.2 mg/dL (ref 0.2–1.0)
pH, UA: 7.5 (ref 5.0–7.5)

## 2019-01-28 NOTE — Progress Notes (Signed)
   01/28/19  CC:  Chief Complaint  Patient presents with  . Cysto    HPI: Episode gross hematuria with urgency which resolved  Blood pressure 135/82, pulse 81, height 5\' 4"  (1.626 m), weight 167 lb (75.8 kg). NED. A&Ox3.   No respiratory distress   Abd soft, NT, ND Atrophic external genitalia with patent urethral meatus  CTU: No upper tract abnormalities.  Indeterminate hepatic lesion measuring 1.6 cm.  Abdominal MRI with and without was recommended.  Cystoscopy Procedure Note  Patient identification was confirmed, informed consent was obtained, and patient was prepped using Betadine solution.  Lidocaine jelly was administered per urethral meatus.    Procedure: - Flexible cystoscope introduced, without any difficulty.   - Thorough search of the bladder revealed:    normal urethral meatus    normal urothelium    no stones    no ulcers     no tumors    no urethral polyps    no trabeculation  - Ureteral orifices were normal in position and appearance.  Post-Procedure: - Patient tolerated the procedure well  Assessment/ Plan: No significant genitourinary abnormalities on CT urogram and cystoscopy.  Her symptoms have resolved and urine is clear.  She may have passed a small ureteral calculus.  Discussed hepatic findings on her CT.  I offered to order a abdominal MRI however she may want to discuss with her PCP.   Abbie Sons, MD

## 2019-02-01 ENCOUNTER — Encounter: Payer: Self-pay | Admitting: Urology

## 2019-03-04 ENCOUNTER — Other Ambulatory Visit: Payer: Self-pay | Admitting: Internal Medicine

## 2019-03-04 DIAGNOSIS — Z1231 Encounter for screening mammogram for malignant neoplasm of breast: Secondary | ICD-10-CM

## 2019-03-04 DIAGNOSIS — R1900 Intra-abdominal and pelvic swelling, mass and lump, unspecified site: Secondary | ICD-10-CM

## 2019-04-01 ENCOUNTER — Ambulatory Visit
Admission: RE | Admit: 2019-04-01 | Discharge: 2019-04-01 | Disposition: A | Discharge status: Home / Self Care | Payer: 59 | Source: Ambulatory Visit | Attending: Internal Medicine | Admitting: Internal Medicine

## 2019-04-01 ENCOUNTER — Encounter: Payer: Self-pay | Admitting: Radiology

## 2019-04-01 ENCOUNTER — Other Ambulatory Visit: Payer: Self-pay

## 2019-04-01 DIAGNOSIS — R1900 Intra-abdominal and pelvic swelling, mass and lump, unspecified site: Secondary | ICD-10-CM | POA: Insufficient documentation

## 2019-04-01 MED ORDER — GADOBUTROL 1 MMOL/ML IV SOLN
7.0000 mL | Freq: Once | INTRAVENOUS | Status: AC | PRN
Start: 2019-04-01 — End: 2019-04-01
  Administered 2019-04-01: 7 mL via INTRAVENOUS

## 2019-06-02 ENCOUNTER — Ambulatory Visit
Admission: RE | Admit: 2019-06-02 | Discharge: 2019-06-02 | Disposition: A | Discharge status: Home / Self Care | Payer: 59 | Source: Ambulatory Visit | Attending: Internal Medicine | Admitting: Internal Medicine

## 2019-06-02 DIAGNOSIS — Z1231 Encounter for screening mammogram for malignant neoplasm of breast: Secondary | ICD-10-CM | POA: Insufficient documentation

## 2020-01-23 IMAGING — RF DG ESOPHAGUS
4 series · 14 of 20 positions shown · non-contrast
Comparison: None.

CLINICAL DATA: Possible hiatal hernia, reflux medication no longer
working

EXAM:
ESOPHOGRAM / BARIUM SWALLOW / BARIUM TABLET STUDY
TECHNIQUE: Combined double contrast and single contrast examination performed
using effervescent crystals, thick barium liquid, and thin barium
liquid. The patient was observed with fluoroscopy swallowing a 13 mm
barium sulphate tablet.
FLUOROSCOPY TIME:  Fluoroscopy Time:  1 minute 18 seconds
Radiation Exposure Index (if provided by the fluoroscopic device):
8.9 mGy
Number of Acquired Spot Images: 0

[Series 1: cp_standard · 0.25mm/px · 2 of 22 frames shown (1 of 4)]
[frame 4/22]
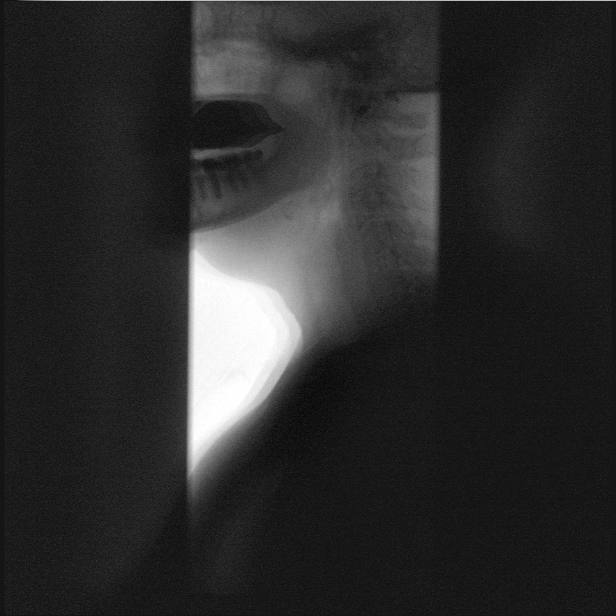
[frame 19/22]
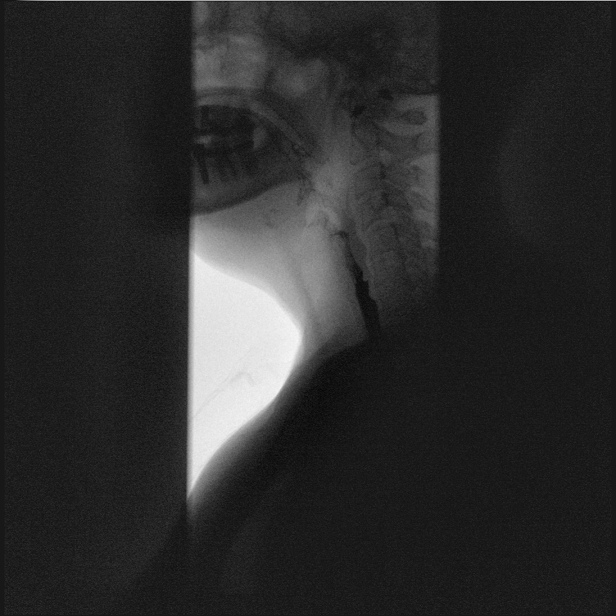

[Series 2: cp_standard · 0.25mm/px · 3 of 51 frames shown (2 of 4)]
[frame 8/51]
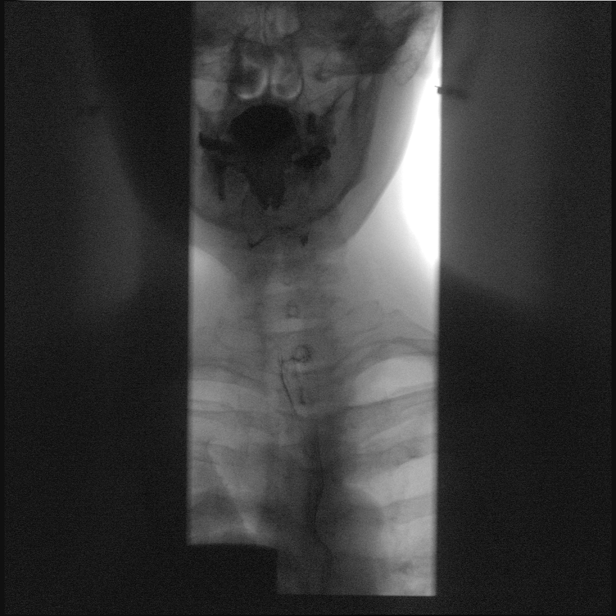
[frame 26/51]
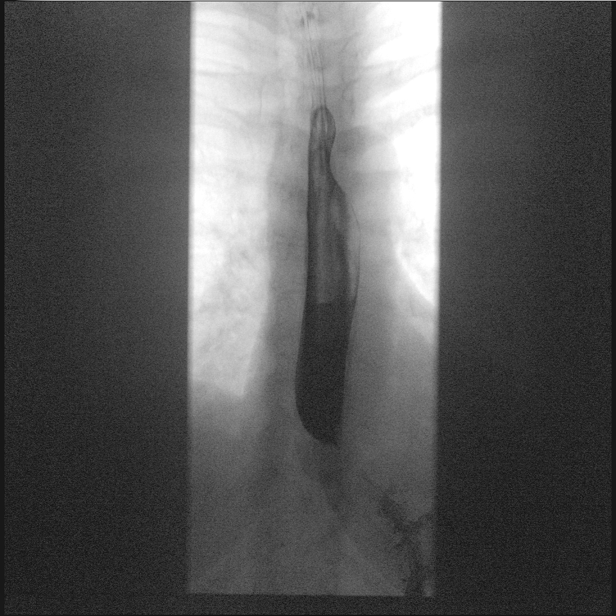
[frame 44/51]
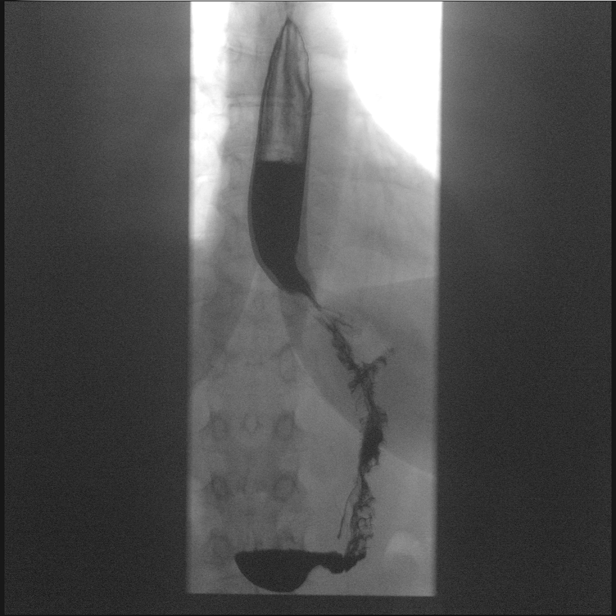

[Series 3: cp_standard · 0.25mm/px · 3 of 50 frames shown (3 of 4)]
[frame 8/50]
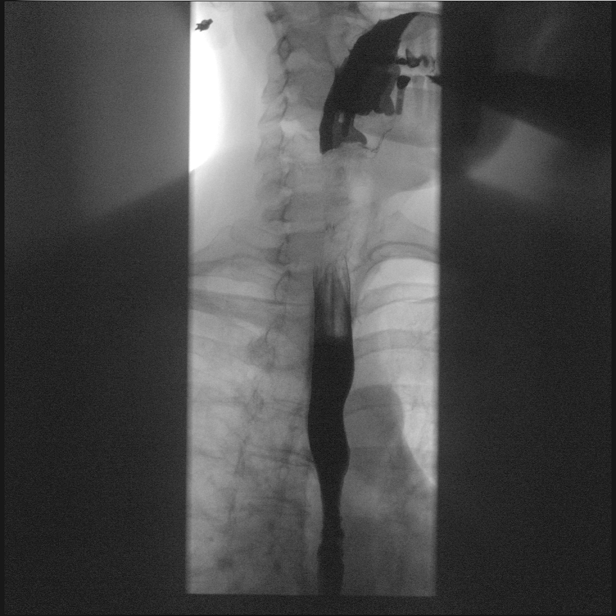
[frame 27/50]
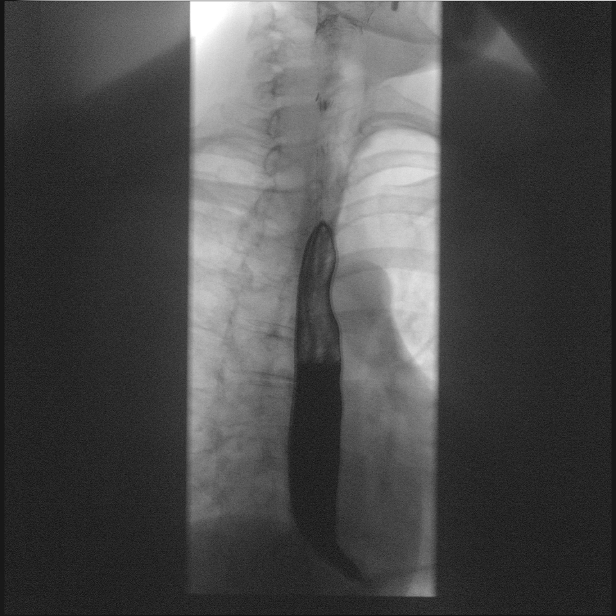
[frame 43/50]
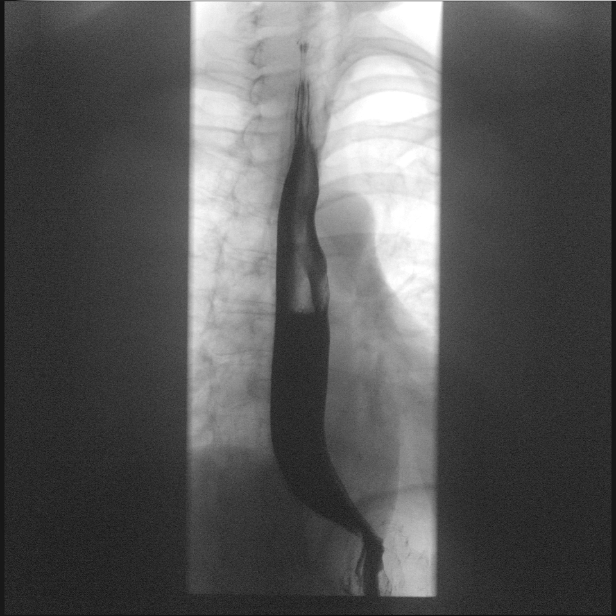

[Series 4: cp_standard · 0.25mm/px · 6 of 9 slices shown (4 of 4)]
[im 2/9]
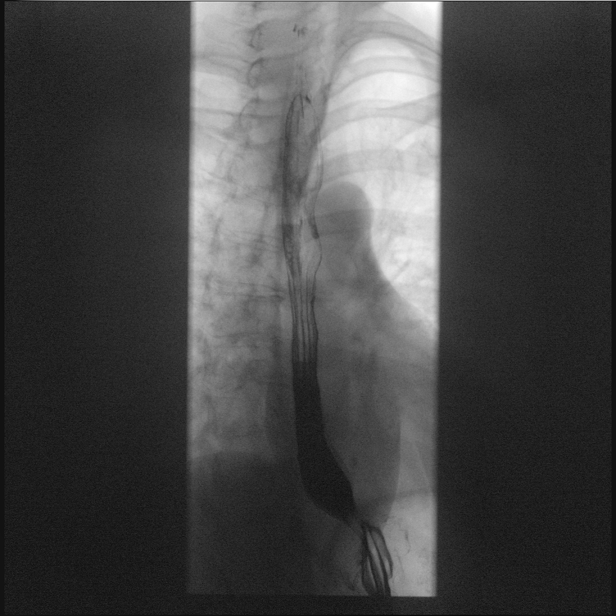
[im 3/9]
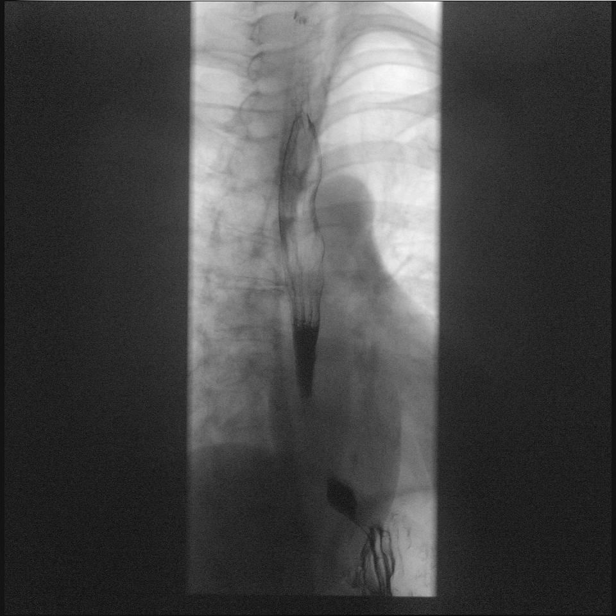
[im 5/9]
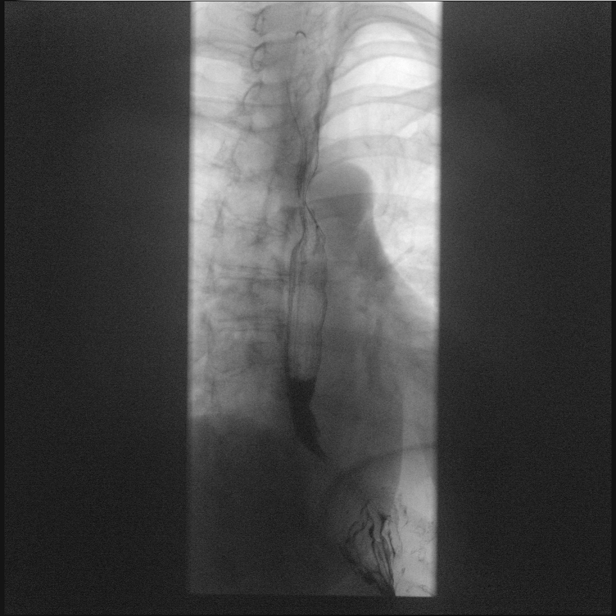
[im 6/9]
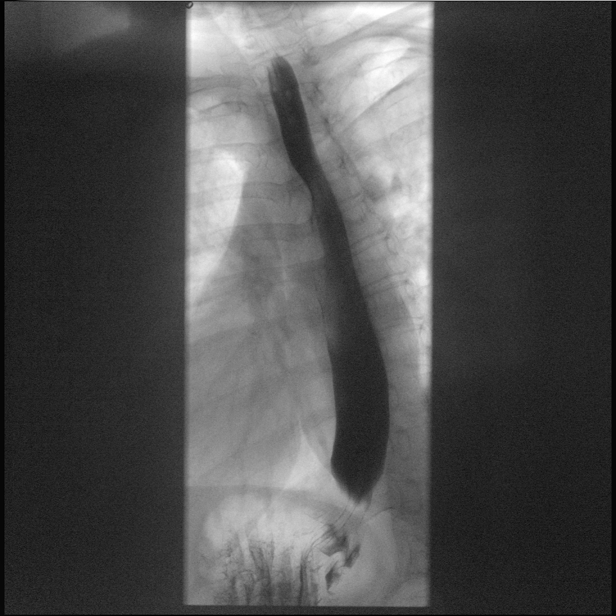
[im 7/9]
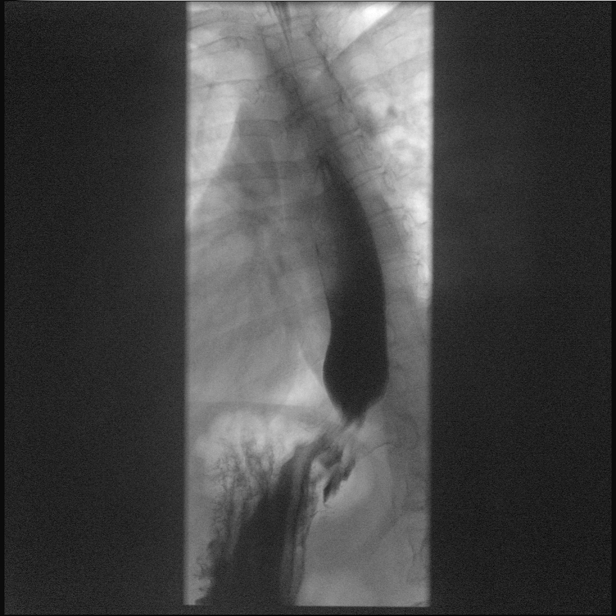
[im 9/9]
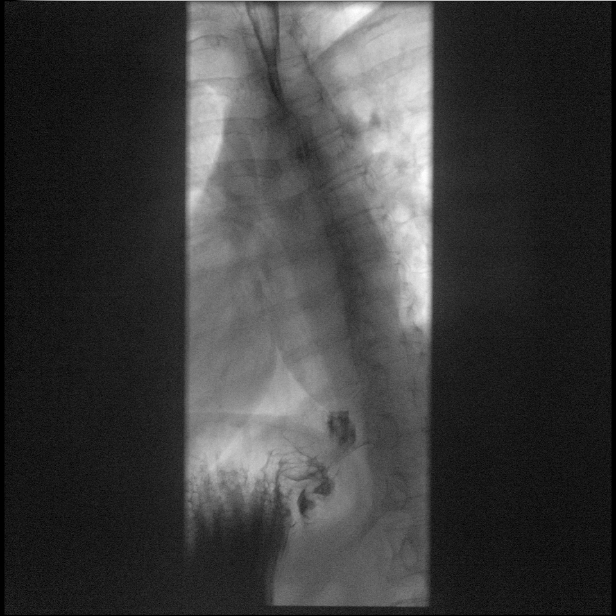

[14 of 20 positions shown; findings below may reference images not displayed]

FINDINGS: There was normal pharyngeal anatomy and motility. Contrast flowed
freely through the esophagus without evidence of stricture or mass.
There was normal esophageal mucosa without evidence of irregularity
or ulceration. Esophageal motility was normal. No evidence of
reflux. Very small transient hiatal hernia.

At the end of the examination a 13 mm barium tablet was administered
which transited through the esophagus and esophagogastric junction
without delay.
IMPRESSION: 1. Very small transient hiatal hernia.
2. No gastroesophageal reflux observed on today's exam.

## 2020-07-01 ENCOUNTER — Other Ambulatory Visit: Payer: Self-pay | Admitting: Internal Medicine

## 2020-07-01 DIAGNOSIS — Z1231 Encounter for screening mammogram for malignant neoplasm of breast: Secondary | ICD-10-CM

## 2020-09-08 ENCOUNTER — Other Ambulatory Visit: Payer: Self-pay

## 2020-09-08 ENCOUNTER — Ambulatory Visit
Admission: RE | Admit: 2020-09-08 | Discharge: 2020-09-08 | Disposition: A | Discharge status: Home / Self Care | Payer: BLUE CROSS/BLUE SHIELD | Source: Ambulatory Visit | Attending: Internal Medicine | Admitting: Internal Medicine

## 2020-09-08 DIAGNOSIS — Z1231 Encounter for screening mammogram for malignant neoplasm of breast: Secondary | ICD-10-CM | POA: Insufficient documentation

## 2022-05-14 ENCOUNTER — Other Ambulatory Visit: Payer: Self-pay | Admitting: Internal Medicine

## 2022-05-14 DIAGNOSIS — Z1231 Encounter for screening mammogram for malignant neoplasm of breast: Secondary | ICD-10-CM

## 2022-05-30 ENCOUNTER — Ambulatory Visit
Admission: RE | Admit: 2022-05-30 | Discharge: 2022-05-30 | Disposition: A | Discharge status: Home / Self Care | Payer: Medicare Other | Source: Ambulatory Visit | Attending: Internal Medicine | Admitting: Internal Medicine

## 2022-05-30 DIAGNOSIS — Z1231 Encounter for screening mammogram for malignant neoplasm of breast: Secondary | ICD-10-CM | POA: Insufficient documentation

## 2022-10-18 ENCOUNTER — Ambulatory Visit: Payer: Medicare Other

## 2022-10-18 DIAGNOSIS — Z09 Encounter for follow-up examination after completed treatment for conditions other than malignant neoplasm: Secondary | ICD-10-CM | POA: Diagnosis not present

## 2022-10-18 DIAGNOSIS — K640 First degree hemorrhoids: Secondary | ICD-10-CM | POA: Diagnosis not present

## 2022-10-18 DIAGNOSIS — Z86010 Personal history of colonic polyps: Secondary | ICD-10-CM | POA: Diagnosis not present

## 2022-10-18 DIAGNOSIS — D120 Benign neoplasm of cecum: Secondary | ICD-10-CM | POA: Diagnosis not present

## 2022-10-18 DIAGNOSIS — D123 Benign neoplasm of transverse colon: Secondary | ICD-10-CM | POA: Diagnosis not present

## 2023-04-02 ENCOUNTER — Ambulatory Visit
Admission: EM | Admit: 2023-04-02 | Discharge: 2023-04-02 | Disposition: A | Discharge status: Home / Self Care | Payer: Medicare Other | Attending: Emergency Medicine | Admitting: Emergency Medicine

## 2023-04-02 DIAGNOSIS — J0100 Acute maxillary sinusitis, unspecified: Secondary | ICD-10-CM | POA: Diagnosis not present

## 2023-04-02 MED ORDER — AMOXICILLIN-POT CLAVULANATE 875-125 MG PO TABS: 1.0000 | ORAL_TABLET | Freq: Two times a day (BID) | ORAL | 0 refills | Status: AC

## 2023-04-02 NOTE — Discharge Instructions (Signed)
Today you are being treated for sinus infection  Begin Augmentin every morning and every evening for 10 days  You can take Tylenol and/or Ibuprofen as needed for fever reduction and pain relief.   For cough: honey 1/2 to 1 teaspoon (you can dilute the honey in water or another fluid).  You can also use guaifenesin and dextromethorphan for cough. You can use a humidifier for chest congestion and cough.  If you don't have a humidifier, you can sit in the bathroom with the hot shower running.      For sore throat: try warm salt water gargles, cepacol lozenges, throat spray, warm tea or water with lemon/honey, popsicles or ice, or OTC cold relief medicine for throat discomfort.   For congestion: take a daily anti-histamine like Zyrtec, Claritin, and a oral decongestant, such as pseudoephedrine.  You can also use Flonase 1-2 sprays in each nostril daily.   It is important to stay hydrated: drink plenty of fluids (water, gatorade/powerade/pedialyte, juices, or teas) to keep your throat moisturized and help further relieve irritation/discomfort.

## 2023-04-02 NOTE — ED Triage Notes (Signed)
Patient to Urgent Care with complaints of nasal congestion/ drainage/ cough/ headaches and facial pain.  Symptoms started 11 days ago.  Has been taking advil cold and sinus.

## 2023-04-02 NOTE — ED Provider Notes (Signed)
[name]    CSN: [id number] Arrival date & time: 04/02/23  0957      History   Chief Complaint Chief Complaint  Patient presents with   Nasal Congestion   Cough    HPI Connie Thomas is a 67 y.o. female.   Patient presents for evaluation of a primarily nonproductive cough, nasal congestion and sinus pressure along the forehead is in the cheeks, intermittent generalized headaches, increased fatigue and shortness of breath at rest present for 11 days.  Poor appetite but able to tolerate food and liquids.  No known sick contacts prior.  Has attempted use of Advil sinus which has been somewhat helpful but symptoms have persisted.  Denies presence of fever.  Past Medical History:  Diagnosis Date   Adjustment disorder with mixed anxiety and depressed mood 01/13/2014   Anxiety    Panic attacks   Benign neoplasm of ascending colon    Dysrhythmia    Endometriosis    Female rectocele without uterine prolapse 01/13/2014   Gastroesophageal reflux disease    GERD (gastroesophageal reflux disease)    Other specified diseases of esophagus    Symptomatic PVCs 05/08/2016   Overview:  ETT/echo neg 1/18    Patient Active Problem List   Diagnosis Date Noted   B12 deficiency 01/22/2017   Hyperlipidemia, mixed 01/22/2017   Low serum vitamin D 01/22/2017   Tubular adenoma 01/22/2017   Osteoarthritis of knee 09/19/2016   Special screening for malignant neoplasms, colon    Benign neoplasm of cecum    Benign neoplasm of ascending colon    Gastroesophageal reflux disease    Other specified disease of esophagus    Symptomatic PVCs 05/08/2016   Chronic reflux esophagitis 04/10/2016   Adjustment disorder with mixed anxiety and depressed mood 01/13/2014   Female rectocele without uterine prolapse 01/13/2014    Past Surgical History:  Procedure Laterality Date   BLADDER SURGERY     COLONOSCOPY WITH PROPOFOL N/A 06/15/2016   Procedure: COLONOSCOPY WITH PROPOFOL;  Surgeon: Midge Minium, MD;  [facility];  Service: Endoscopy;  Laterality: N/A;   ESOPHAGOGASTRODUODENOSCOPY (EGD) WITH PROPOFOL N/A 06/15/2016   Procedure: ESOPHAGOGASTRODUODENOSCOPY (EGD) WITH PROPOFOL;  Surgeon: Midge Minium, MD;  Location: [facility];  Service: Endoscopy;  Laterality: N/A;   PELVIC LAPAROSCOPY     POLYPECTOMY  06/15/2016   Procedure: POLYPECTOMY;  Surgeon: Midge Minium, MD;  Location: [facility];  Service: Endoscopy;;    OB History     Gravida  2   Para  1   Term      Preterm      AB  1   Living  1      SAB  1   IAB      Ectopic      Multiple      Live Births               Home Medications    Prior to Admission medications   Medication Sig Start Date End Date Taking? Authorizing Provider  amoxicillin-clavulanate (AUGMENTIN) 875-125 MG tablet Take 1 tablet by mouth every 12 (twelve) hours for 10 days. 04/02/23 04/12/23 Yes Channing Yeager, Elita Boone, NP  ALPRAZolam Prudy Feeler) 0.25 MG tablet TAKE ONE TABLET BY MOUTH AT BEDTIME AS NEEDED FOR ANXIETY 03/21/15   Harrington Challenger, NP  Ascorbic Acid (VITAMIN C) 1000 MG tablet Take 1,000 mg by mouth daily.    [provider]  DULoxetine (CYMBALTA) 60 MG capsule Take  1 capsule (60 mg total) by mouth daily. 01/18/15   Harrington Challenger, NP  meloxicam (MOBIC) 15 MG tablet Take 1 tablet (15 mg total) by mouth daily. 12/23/18   Felecia Shelling, DPM  metoprolol tartrate (LOPRESSOR) 25 MG tablet Take 25 mg by mouth daily.    [provider]  omeprazole (PRILOSEC) 40 MG capsule  12/19/18   [provider]    Family History Family History  Problem Relation Age of Onset   Hypertension Mother    Cancer Mother        Lung   Stroke Mother    Hypertension Father    Stroke Father    Cancer Father        Lung   Uterine cancer Maternal Grandmother    Cancer Maternal Grandfather        Colon   Breast cancer Neg Hx     Social History Social History   Tobacco Use   Smoking status:  Never   Smokeless tobacco: Never  Substance Use Topics   Alcohol use: Yes    Alcohol/week: 3.0 standard drinks of alcohol    Types: 3 Standard drinks or equivalent per week   Drug use: No     Allergies   Patient has no known allergies.   Review of Systems Review of Systems   Physical Exam Triage Vital Signs ED Triage Vitals  Encounter Vitals Group     BP 04/02/23 1021 135/85     Systolic BP Percentile --      Diastolic BP Percentile --      Pulse Rate 04/02/23 1021 77     Resp 04/02/23 1021 17     Temp 04/02/23 1021 98.3 F (36.8 C)     Temp src --      SpO2 04/02/23 1021 98 %     Weight --      Height --      Head Circumference --      Peak Flow --      Pain Score 04/02/23 1012 3     Pain Loc --      Pain Education --      Exclude from Growth Chart --    No data found.  Updated Vital Signs BP 135/85   Pulse 77   Temp 98.3 F (36.8 C)   Resp 17   SpO2 98%   Visual Acuity Right Eye Distance:   Left Eye Distance:   Bilateral Distance:    Right Eye Near:   Left Eye Near:    Bilateral Near:     Physical Exam Constitutional:      Appearance: Normal appearance.  HENT:     Head: Normocephalic.     Right Ear: Tympanic membrane, ear canal and external ear normal.     Left Ear: Tympanic membrane, ear canal and external ear normal.     Nose: Congestion present. No rhinorrhea.     Right Sinus: Maxillary sinus tenderness present.     Left Sinus: Maxillary sinus tenderness present.     Mouth/Throat:     Mouth: Mucous membranes are moist.     Pharynx: Oropharynx is clear. No oropharyngeal exudate or posterior oropharyngeal erythema.  Eyes:     Extraocular Movements: Extraocular movements intact.  Cardiovascular:     Rate and Rhythm: Normal rate and regular rhythm.     Pulses: Normal pulses.     Heart sounds: Normal heart sounds.  Pulmonary:     Effort: Pulmonary effort  is normal.     Breath sounds: Normal breath sounds.  Musculoskeletal:     Cervical  back: Normal range of motion and neck supple.  Neurological:     Mental Status: She is alert and oriented to person, place, and time. Mental status is at baseline.      UC Treatments / Results  Labs (all labs ordered are listed, but only abnormal results are displayed) Labs Reviewed - No data to display  EKG   Radiology No results found.  Procedures Procedures (including critical care time)  Medications Ordered in UC Medications - No data to display  Initial Impression / Assessment and Plan / UC Course  I have reviewed the triage vital signs and the nursing notes.  Pertinent labs & imaging results that were available during my care of the patient were reviewed by me and considered in my medical decision making (see chart for details).  Acute nonrecurrent maxillary sinusitis   Patient is in no signs of distress nor toxic appearing.  Vital signs are stable.  Low suspicion for pneumonia, pneumothorax or bronchitis and therefore will defer imaging.  Symptoms and presentation consistent with a sinusitis, present for 11 days without signs of resolution therefore providing bacterial coverage.  Prescribed Augmentin.  Viral Testing deferred due to timeline.May use additional over-the-counter medications as needed for supportive care.  May follow-up with urgent care as needed if symptoms persist or worsen.   Final Clinical Impressions(s) / UC Diagnoses   Final diagnoses:  Acute non-recurrent maxillary sinusitis     Discharge Instructions      Today you are being treated for sinus infection  Begin Augmentin every morning and every evening for 10 days  You can take Tylenol and/or Ibuprofen as needed for fever reduction and pain relief.   For cough: honey 1/2 to 1 teaspoon (you can dilute the honey in water or another fluid).  You can also use guaifenesin and dextromethorphan for cough. You can use a humidifier for chest congestion and cough.  If you don't have a humidifier, you  can sit in the bathroom with the hot shower running.      For sore throat: try warm salt water gargles, cepacol lozenges, throat spray, warm tea or water with lemon/honey, popsicles or ice, or OTC cold relief medicine for throat discomfort.   For congestion: take a daily anti-histamine like Zyrtec, Claritin, and a oral decongestant, such as pseudoephedrine.  You can also use Flonase 1-2 sprays in each nostril daily.   It is important to stay hydrated: drink plenty of fluids (water, gatorade/powerade/pedialyte, juices, or teas) to keep your throat moisturized and help further relieve irritation/discomfort.    ED Prescriptions     Medication Sig Dispense Auth. Provider   amoxicillin-clavulanate (AUGMENTIN) 875-125 MG tablet Take 1 tablet by mouth every 12 (twelve) hours for 10 days. 20 tablet Valinda Hoar, NP      PDMP not reviewed this encounter.   Valinda Hoar, NP 04/02/23 1048

## 2023-05-27 ENCOUNTER — Other Ambulatory Visit: Payer: Self-pay

## 2023-05-27 ENCOUNTER — Ambulatory Visit
Admission: EM | Admit: 2023-05-27 | Discharge: 2023-05-27 | Disposition: A | Discharge status: Home / Self Care | Payer: Medicare Other

## 2023-05-27 ENCOUNTER — Encounter: Payer: Self-pay | Admitting: Emergency Medicine

## 2023-05-27 DIAGNOSIS — J0140 Acute pansinusitis, unspecified: Secondary | ICD-10-CM | POA: Diagnosis not present

## 2023-05-27 HISTORY — DX: Essential (primary) hypertension: I10

## 2023-05-27 MED ORDER — DOXYCYCLINE HYCLATE 100 MG PO CAPS: 100.0000 mg | ORAL_CAPSULE | Freq: Two times a day (BID) | ORAL | 0 refills | Status: DC

## 2023-05-27 MED ORDER — IPRATROPIUM BROMIDE 0.03 % NA SOLN: 2.0000 | Freq: Two times a day (BID) | NASAL | 0 refills | Status: AC

## 2023-05-27 NOTE — ED Triage Notes (Signed)
Symptoms started last Monday, 05/20/2023.    Patient has a cough, reports phlegm is "lemony yellow"patient has had a headache, left eye discharge.  Also complains of stuffiness.    Patient has taken advil cold and sinus.

## 2023-05-27 NOTE — ED Provider Notes (Signed)
Connie Thomas    CSN: [id number] Arrival date & time: 05/27/23  0908      History   Chief Complaint Chief Complaint  Patient presents with   URI    HPI Connie Thomas is a 68 y.o. female.   Patient presents for evaluation of nasal congestion, rhinorrhea and productive cough with yellow sputum, sinus pressure across the bilateral cheeks, sensitivity to the top of the head and posterior of the scalp, and bilateral ear itching present for 7 days.  Associated headaches.  Poor appetite but able to tolerate food and liquids.  No known sick contacts prior.  Has attempted use of Advil Cold and Sinus.  Past Medical History:  Diagnosis Date   Adjustment disorder with mixed anxiety and depressed mood 01/13/2014   Anxiety    Panic attacks   Benign neoplasm of ascending colon    Dysrhythmia    Endometriosis    Female rectocele without uterine prolapse 01/13/2014   Gastroesophageal reflux disease    GERD (gastroesophageal reflux disease)    Hypertension    Other specified diseases of esophagus    Symptomatic PVCs 05/08/2016   Overview:  ETT/echo neg 1/18    Patient Active Problem List   Diagnosis Date Noted   B12 deficiency 01/22/2017   Hyperlipidemia, mixed 01/22/2017   Low serum vitamin D 01/22/2017   Tubular adenoma 01/22/2017   Osteoarthritis of knee 09/19/2016   Special screening for malignant neoplasms, colon    Benign neoplasm of cecum    Benign neoplasm of ascending colon    Gastroesophageal reflux disease    Other specified disease of esophagus    Symptomatic PVCs 05/08/2016   Chronic reflux esophagitis 04/10/2016   Adjustment disorder with mixed anxiety and depressed mood 01/13/2014   Female rectocele without uterine prolapse 01/13/2014    Past Surgical History:  Procedure Laterality Date   BLADDER SURGERY     COLONOSCOPY WITH PROPOFOL N/A 06/15/2016   Procedure: COLONOSCOPY WITH PROPOFOL;  Surgeon: Midge Minium, MD;  Location: [facility];   Service: Endoscopy;  Laterality: N/A;   ESOPHAGOGASTRODUODENOSCOPY (EGD) WITH PROPOFOL N/A 06/15/2016   Procedure: ESOPHAGOGASTRODUODENOSCOPY (EGD) WITH PROPOFOL;  Surgeon: Midge Minium, MD;  Location: [facility];  Service: Endoscopy;  Laterality: N/A;   PELVIC LAPAROSCOPY     POLYPECTOMY  06/15/2016   Procedure: POLYPECTOMY;  Surgeon: Midge Minium, MD;  Location: [facility];  Service: Endoscopy;;    OB History     Gravida  2   Para  1   Term      Preterm      AB  1   Living  1      SAB  1   IAB      Ectopic      Multiple      Live Births               Home Medications    Prior to Admission medications   Medication Sig Start Date End Date Taking? Authorizing Provider  doxycycline (VIBRAMYCIN) 100 MG capsule Take 1 capsule (100 mg total) by mouth 2 (two) times daily. 05/27/23  Yes Johnnisha Forton R, NP  ipratropium (ATROVENT) 0.03 % nasal spray Place 2 sprays into both nostrils every 12 (twelve) hours. 05/27/23  Yes Maveryck Bahri R, NP  olmesartan (BENICAR) 20 MG tablet Take 20 mg by mouth daily.   Yes [provider]  ALPRAZolam (XANAX) 0.25 MG tablet TAKE ONE TABLET BY MOUTH AT BEDTIME AS  NEEDED FOR ANXIETY 03/21/15   Harrington Challenger, NP  Ascorbic Acid (VITAMIN C) 1000 MG tablet Take 1,000 mg by mouth daily.    [provider]  DULoxetine (CYMBALTA) 60 MG capsule Take 1 capsule (60 mg total) by mouth daily. 01/18/15   Harrington Challenger, NP  meloxicam (MOBIC) 15 MG tablet Take 1 tablet (15 mg total) by mouth daily. 12/23/18   Felecia Shelling, DPM  metoprolol tartrate (LOPRESSOR) 25 MG tablet Take 25 mg by mouth daily.    [provider]  omeprazole (PRILOSEC) 40 MG capsule  12/19/18   [provider]    Family History Family History  Problem Relation Age of Onset   Hypertension Mother    Cancer Mother        Lung   Stroke Mother    Hypertension Father    Stroke Father    Cancer Father        Lung   Uterine  cancer Maternal Grandmother    Cancer Maternal Grandfather        Colon   Breast cancer Neg Hx     Social History Social History   Tobacco Use   Smoking status: Never   Smokeless tobacco: Never  Vaping Use   Vaping status: Never Used  Substance Use Topics   Alcohol use: Yes    Alcohol/week: 3.0 standard drinks of alcohol    Types: 3 Standard drinks or equivalent per week   Drug use: No     Allergies   Patient has no known allergies.   Review of Systems Review of Systems   Physical Exam Triage Vital Signs ED Triage Vitals  Encounter Vitals Group     BP 05/27/23 0943 125/74     Systolic BP Percentile --      Diastolic BP Percentile --      Pulse Rate 05/27/23 0943 86     Resp 05/27/23 0943 20     Temp 05/27/23 0943 98.6 F (37 C)     Temp Source 05/27/23 0943 Oral     SpO2 05/27/23 0943 97 %     Weight --      Height --      Head Circumference --      Peak Flow --      Pain Score 05/27/23 0940 4     Pain Loc --      Pain Education --      Exclude from Growth Chart --    No data found.  Updated Vital Signs BP 125/74 (BP Location: Left Arm)   Pulse 86   Temp 98.6 F (37 C) (Oral)   Resp 20   SpO2 97%   Visual Acuity Right Eye Distance:   Left Eye Distance:   Bilateral Distance:    Right Eye Near:   Left Eye Near:    Bilateral Near:     Physical Exam Constitutional:      Appearance: Normal appearance.  HENT:     Head: Normocephalic.     Right Ear: Tympanic membrane, ear canal and external ear normal.     Left Ear: Tympanic membrane, ear canal and external ear normal.     Nose: Congestion and rhinorrhea present.     Right Sinus: Maxillary sinus tenderness and frontal sinus tenderness present.     Left Sinus: Maxillary sinus tenderness and frontal sinus tenderness present.     Mouth/Throat:     Pharynx: No oropharyngeal exudate or posterior oropharyngeal erythema.  Eyes:  Extraocular Movements: Extraocular movements intact.   Cardiovascular:     Rate and Rhythm: Normal rate and regular rhythm.     Pulses: Normal pulses.     Heart sounds: Normal heart sounds.  Pulmonary:     Effort: Pulmonary effort is normal.     Breath sounds: Normal breath sounds.  Musculoskeletal:     Cervical back: Normal range of motion and neck supple.  Neurological:     Mental Status: She is alert and oriented to person, place, and time. Mental status is at baseline.      UC Treatments / Results  Labs (all labs ordered are listed, but only abnormal results are displayed) Labs Reviewed - No data to display  EKG   Radiology No results found.  Procedures Procedures (including critical care time)  Medications Ordered in UC Medications - No data to display  Initial Impression / Assessment and Plan / UC Course  I have reviewed the triage vital signs and the nursing notes.  Pertinent labs & imaging results that were available during my care of the patient were reviewed by me and considered in my medical decision making (see chart for details).  Acute nonrecurrent pansinusitis  Patient is in no signs of distress nor toxic appearing.  Vital signs are stable.  Low suspicion for pneumonia, pneumothorax or bronchitis and therefore will defer imaging.  Symptoms present for 7 days, consistent with a sinusitis, no improvement seen with over-the-counter medications.  Prescribed doxycycline and Atrovent nasal spray.May use additional over-the-counter medications as needed for supportive care.  May follow-up with urgent care as needed if symptoms persist or worsen.   Final Clinical Impressions(s) / UC Diagnoses   Final diagnoses:  Acute non-recurrent pansinusitis     Discharge Instructions      You are being treated for a sinus infection  Begin doxycycline every morning and every evening for 10 days  May use nasal spray every morning and every evening to help clear out the sinuses, may use this in addition to Advil Cold and  Sinus    You can take Tylenol and/or Ibuprofen as needed for fever reduction and pain relief.   For cough: honey 1/2 to 1 teaspoon (you can dilute the honey in water or another fluid).  You can also use guaifenesin and dextromethorphan for cough. You can use a humidifier for chest congestion and cough.  If you don't have a humidifier, you can sit in the bathroom with the hot shower running.      For sore throat: try warm salt water gargles, cepacol lozenges, throat spray, warm tea or water with lemon/honey, popsicles or ice, or OTC cold relief medicine for throat discomfort.   For congestion: take a daily anti-histamine like Zyrtec, Claritin, and a oral decongestant, such as pseudoephedrine.  You can also use Flonase 1-2 sprays in each nostril daily.   It is important to stay hydrated: drink plenty of fluids (water, gatorade/powerade/pedialyte, juices, or teas) to keep your throat moisturized and help further relieve irritation/discomfort.    ED Prescriptions     Medication Sig Dispense Auth. Provider   doxycycline (VIBRAMYCIN) 100 MG capsule Take 1 capsule (100 mg total) by mouth 2 (two) times daily. 20 capsule Cande Mastropietro R, NP   ipratropium (ATROVENT) 0.03 % nasal spray Place 2 sprays into both nostrils every 12 (twelve) hours. 30 mL Valinda Hoar, NP      PDMP not reviewed this encounter.   Valinda Hoar, NP 05/27/23 1259

## 2023-05-27 NOTE — Discharge Instructions (Signed)
You are being treated for a sinus infection  Begin doxycycline every morning and every evening for 10 days  May use nasal spray every morning and every evening to help clear out the sinuses, may use this in addition to Advil Cold and Sinus    You can take Tylenol and/or Ibuprofen as needed for fever reduction and pain relief.   For cough: honey 1/2 to 1 teaspoon (you can dilute the honey in water or another fluid).  You can also use guaifenesin and dextromethorphan for cough. You can use a humidifier for chest congestion and cough.  If you don't have a humidifier, you can sit in the bathroom with the hot shower running.      For sore throat: try warm salt water gargles, cepacol lozenges, throat spray, warm tea or water with lemon/honey, popsicles or ice, or OTC cold relief medicine for throat discomfort.   For congestion: take a daily anti-histamine like Zyrtec, Claritin, and a oral decongestant, such as pseudoephedrine.  You can also use Flonase 1-2 sprays in each nostril daily.   It is important to stay hydrated: drink plenty of fluids (water, gatorade/powerade/pedialyte, juices, or teas) to keep your throat moisturized and help further relieve irritation/discomfort.

## 2023-11-15 ENCOUNTER — Ambulatory Visit
Admission: RE | Admit: 2023-11-15 | Discharge: 2023-11-15 | Disposition: A | Discharge status: Home / Self Care | Payer: Self-pay | Source: Ambulatory Visit | Attending: Emergency Medicine | Admitting: Emergency Medicine

## 2023-11-15 VITALS — BP 108/69 | HR 106 | Temp 99.7°F | Resp 18

## 2023-11-15 DIAGNOSIS — N3000 Acute cystitis without hematuria: Secondary | ICD-10-CM | POA: Diagnosis not present

## 2023-11-15 DIAGNOSIS — R350 Frequency of micturition: Secondary | ICD-10-CM | POA: Diagnosis present

## 2023-11-15 LAB — POCT URINALYSIS DIP (MANUAL ENTRY)
Bilirubin, UA: NEGATIVE
Glucose, UA: NEGATIVE mg/dL
Ketones, POC UA: NEGATIVE mg/dL
Nitrite, UA: POSITIVE — AB
Protein Ur, POC: 100 mg/dL — AB
Spec Grav, UA: 1.015 (ref 1.010–1.025)
Urobilinogen, UA: 1 U/dL
pH, UA: 8.5 — AB (ref 5.0–8.0)

## 2023-11-15 MED ORDER — NITROFURANTOIN MONOHYD MACRO 100 MG PO CAPS: 100.0000 mg | ORAL_CAPSULE | Freq: Two times a day (BID) | ORAL | 0 refills | Status: DC

## 2023-11-15 MED ORDER — FLUCONAZOLE 150 MG PO TABS: 150.0000 mg | ORAL_TABLET | ORAL | 0 refills | Status: AC | PRN

## 2023-11-15 NOTE — ED Triage Notes (Signed)
 Patient reports chills, urinary frequency and foul odor. X 3 days.

## 2023-11-15 NOTE — Discharge Instructions (Addendum)
 Your urinalysis shows Connie Thomas blood cells and nitrates which are indicative of infection, your urine will be sent to the lab to determine exactly which bacteria is present, if any changes need to be made to your medications you will be notified  Begin use of Atrovent  twice daily for 5 days, have sent Diflucan  in case you start to experience yeast symptoms  You may use over-the-counter Azo to help minimize your symptoms until antibiotic removes bacteria, this medication will turn your urine orange  Increase your fluid intake through use of water   As always practice good hygiene, wiping front to back and avoidance of scented vaginal products to prevent further irritation  If symptoms continue to persist after use of medication or recur please follow-up with urgent care or your primary doctor as needed

## 2023-11-15 NOTE — ED Provider Notes (Signed)
 CAY RALPH PELT    CSN: [id number] Arrival date & time: 11/15/23  0847      History   Chief Complaint Chief Complaint  Patient presents with   Chills    Muscle aches, frequent urination - Entered by patient   Urinary Frequency    HPI Connie Thomas is a 68 y.o. female.   Patient presents for evaluation of subjective fever and chills, generalized bodyaches, urinary frequency, lower abdominal pressure and a foul urinary odor present for 3 days.  Attempted use of Azo which was ineffective.  Denies back pain, vaginal symptoms, hematuria.  Past Medical History:  Diagnosis Date   Adjustment disorder with mixed anxiety and depressed mood 01/13/2014   Anxiety    Panic attacks   Benign neoplasm of ascending colon    Dysrhythmia    Endometriosis    Female rectocele without uterine prolapse 01/13/2014   Gastroesophageal reflux disease    GERD (gastroesophageal reflux disease)    Hypertension    Other specified diseases of esophagus    Symptomatic PVCs 05/08/2016   Overview:  ETT/echo neg 1/18    Patient Active Problem List   Diagnosis Date Noted   B12 deficiency 01/22/2017   Hyperlipidemia, mixed 01/22/2017   Low serum vitamin D  01/22/2017   Tubular adenoma 01/22/2017   Osteoarthritis of knee 09/19/2016   Special screening for malignant neoplasms, colon    Benign neoplasm of cecum    Benign neoplasm of ascending colon    Gastroesophageal reflux disease    Other specified disease of esophagus    Symptomatic PVCs 05/08/2016   Chronic reflux esophagitis 04/10/2016   Adjustment disorder with mixed anxiety and depressed mood 01/13/2014   Female rectocele without uterine prolapse 01/13/2014    Past Surgical History:  Procedure Laterality Date   BLADDER SURGERY     COLONOSCOPY WITH PROPOFOL  N/A 06/15/2016   Procedure: COLONOSCOPY WITH PROPOFOL ;  Surgeon: Rogelia Copping, MD;  Location: [facility];  Service: Endoscopy;  Laterality: N/A;    ESOPHAGOGASTRODUODENOSCOPY (EGD) WITH PROPOFOL  N/A 06/15/2016   Procedure: ESOPHAGOGASTRODUODENOSCOPY (EGD) WITH PROPOFOL ;  Surgeon: Rogelia Copping, MD;  Location: [facility];  Service: Endoscopy;  Laterality: N/A;   PELVIC LAPAROSCOPY     POLYPECTOMY  06/15/2016   Procedure: POLYPECTOMY;  Surgeon: Rogelia Copping, MD;  Location: [facility];  Service: Endoscopy;;    OB History     Gravida  2   Para  1   Term      Preterm      AB  1   Living  1      SAB  1   IAB      Ectopic      Multiple      Live Births               Home Medications    Prior to Admission medications   Medication Sig Start Date End Date Taking? Authorizing Provider  fluconazole  (DIFLUCAN ) 150 MG tablet Take 1 tablet (150 mg total) by mouth every three (3) days as needed for up to 2 doses. 11/15/23  Yes Riley Papin R, NP  nitrofurantoin, macrocrystal-monohydrate, (MACROBID) 100 MG capsule Take 1 capsule (100 mg total) by mouth 2 (two) times daily. 11/15/23  Yes Aloni Chuang R, NP  zinc gluconate 50 MG tablet Take 50 mg by mouth daily.   Yes [provider]  ALPRAZolam  (XANAX ) 0.25 MG tablet TAKE ONE TABLET BY MOUTH AT BEDTIME AS NEEDED FOR ANXIETY 03/21/15  Neysa Inocente PARAS, NP  Ascorbic Acid (VITAMIN C) 1000 MG tablet Take 1,000 mg by mouth daily.    [provider]  doxycycline  (VIBRAMYCIN ) 100 MG capsule Take 1 capsule (100 mg total) by mouth 2 (two) times daily. 05/27/23   Teresa Shelba SAUNDERS, NP  DULoxetine  (CYMBALTA ) 60 MG capsule Take 1 capsule (60 mg total) by mouth daily. 01/18/15   Neysa Inocente PARAS, NP  ipratropium (ATROVENT ) 0.03 % nasal spray Place 2 sprays into both nostrils every 12 (twelve) hours. 05/27/23   Bonner Larue, Shelba SAUNDERS, NP  meloxicam  (MOBIC ) 15 MG tablet Take 1 tablet (15 mg total) by mouth daily. 12/23/18   Janit Thresa HERO, DPM  metoprolol tartrate (LOPRESSOR) 25 MG tablet Take 25 mg by mouth daily.    [provider]  olmesartan (BENICAR) 20  MG tablet Take 20 mg by mouth daily.    [provider]  omeprazole  (PRILOSEC) 40 MG capsule  12/19/18   [provider]    Family History Family History  Problem Relation Age of Onset   Hypertension Mother    Cancer Mother        Lung   Stroke Mother    Hypertension Father    Stroke Father    Cancer Father        Lung   Uterine cancer Maternal Grandmother    Cancer Maternal Grandfather        Colon   Breast cancer Neg Hx     Social History Social History   Tobacco Use   Smoking status: Never   Smokeless tobacco: Never  Vaping Use   Vaping status: Never Used  Substance Use Topics   Alcohol use: Yes    Alcohol/week: 3.0 standard drinks of alcohol    Types: 3 Standard drinks or equivalent per week   Drug use: No     Allergies   Patient has no known allergies.   Review of Systems Review of Systems   Physical Exam Triage Vital Signs ED Triage Vitals  Encounter Vitals Group     BP 11/15/23 0913 108/69     Girls Systolic BP Percentile --      Girls Diastolic BP Percentile --      Boys Systolic BP Percentile --      Boys Diastolic BP Percentile --      Pulse Rate 11/15/23 0913 (!) 106     Resp 11/15/23 0913 18     Temp 11/15/23 0913 99.7 F (37.6 C)     Temp Source 11/15/23 0913 Oral     SpO2 11/15/23 0913 98 %     Weight --      Height --      Head Circumference --      Peak Flow --      Pain Score 11/15/23 0916 0     Pain Loc --      Pain Education --      Exclude from Growth Chart --    No data found.  Updated Vital Signs BP 108/69 (BP Location: Left Arm)   Pulse (!) 106   Temp 99.7 F (37.6 C) (Oral)   Resp 18   SpO2 98%   Visual Acuity Right Eye Distance:   Left Eye Distance:   Bilateral Distance:    Right Eye Near:   Left Eye Near:    Bilateral Near:     Physical Exam Constitutional:      Appearance: Normal appearance.  Eyes:     Extraocular  Movements: Extraocular movements intact.  Pulmonary:     Effort:  Pulmonary effort is normal.  Abdominal:     Tenderness: There is no abdominal tenderness. There is no right CVA tenderness, left CVA tenderness or guarding.  Neurological:     Mental Status: She is alert and oriented to person, place, and time.      UC Treatments / Results  Labs (all labs ordered are listed, but only abnormal results are displayed) Labs Reviewed  POCT URINALYSIS DIP (MANUAL ENTRY) - Abnormal; Notable for the following components:      Result Value   Clarity, UA cloudy (*)    Blood, UA trace-intact (*)    pH, UA 8.5 (*)    Protein Ur, POC =100 (*)    Nitrite, UA Positive (*)    Leukocytes, UA Small (1+) (*)    All other components within normal limits  URINE CULTURE    EKG   Radiology No results found.  Procedures Procedures (including critical care time)  Medications Ordered in UC Medications - No data to display  Initial Impression / Assessment and Plan / UC Course  I have reviewed the triage vital signs and the nursing notes.  Pertinent labs & imaging results that were available during my care of the patient were reviewed by me and considered in my medical decision making (see chart for details).  Acute cystitis without hematuria, urinary frequency  Urinalysis showing nitrates and leukocytes, sent for culture, discussed findings with patient, prescribed Macrobid and sent Diflucan  as she endorses yeast symptoms with antibiotic use, recommended over-the-counter medications and nonpharmacological measures, may follow-up as needed Final Clinical Impressions(s) / UC Diagnoses   Final diagnoses:  Urinary frequency  Acute cystitis without hematuria     Discharge Instructions      Your urinalysis shows Debralee Braaksma blood cells and nitrates which are indicative of infection, your urine will be sent to the lab to determine exactly which bacteria is present, if any changes need to be made to your medications you will be notified  Begin use of Atrovent  twice  daily for 5 days, have sent Diflucan  in case you start to experience yeast symptoms  You may use over-the-counter Azo to help minimize your symptoms until antibiotic removes bacteria, this medication will turn your urine orange  Increase your fluid intake through use of water   As always practice good hygiene, wiping front to back and avoidance of scented vaginal products to prevent further irritation  If symptoms continue to persist after use of medication or recur please follow-up with urgent care or your primary doctor as needed    ED Prescriptions     Medication Sig Dispense Auth. Provider   nitrofurantoin, macrocrystal-monohydrate, (MACROBID) 100 MG capsule Take 1 capsule (100 mg total) by mouth 2 (two) times daily. 10 capsule Konnor Jorden R, NP   fluconazole  (DIFLUCAN ) 150 MG tablet Take 1 tablet (150 mg total) by mouth every three (3) days as needed for up to 2 doses. 2 tablet Rheana Casebolt R, NP      PDMP not reviewed this encounter.   Teresa Shelba SAUNDERS, [place] 11/15/23 [phone]

## 2023-11-18 ENCOUNTER — Ambulatory Visit (HOSPITAL_COMMUNITY): Payer: Self-pay

## 2023-11-18 LAB — URINE CULTURE: Culture: >=100000 — AB

## 2023-12-16 ENCOUNTER — Ambulatory Visit
Admission: RE | Admit: 2023-12-16 | Discharge: 2023-12-16 | Disposition: A | Discharge status: Home / Self Care | Payer: Self-pay | Attending: Emergency Medicine | Admitting: Emergency Medicine

## 2023-12-16 ENCOUNTER — Other Ambulatory Visit: Payer: Self-pay

## 2023-12-16 ENCOUNTER — Other Ambulatory Visit: Payer: Self-pay | Admitting: Emergency Medicine

## 2023-12-16 VITALS — BP 140/77 | HR 80 | Temp 98.4°F | Resp 17

## 2023-12-16 DIAGNOSIS — N390 Urinary tract infection, site not specified: Secondary | ICD-10-CM | POA: Diagnosis not present

## 2023-12-16 LAB — POCT URINE DIPSTICK
Bilirubin, UA: NEGATIVE
Blood, UA: NEGATIVE
Glucose, UA: NEGATIVE mg/dL
Ketones, POC UA: NEGATIVE mg/dL
Nitrite, UA: NEGATIVE
Protein Ur, POC: NEGATIVE mg/dL
Spec Grav, UA: 1.02 (ref 1.010–1.025)
Urobilinogen, UA: 0.2 U/dL
pH, UA: 8.5 — AB (ref 5.0–8.0)

## 2023-12-16 MED ORDER — CEPHALEXIN 500 MG PO CAPS: 500.0000 mg | ORAL_CAPSULE | Freq: Three times a day (TID) | ORAL | 0 refills | Status: AC

## 2023-12-16 NOTE — ED Triage Notes (Signed)
 Pt is here with frequency and odor that started 10 days ago, pt has not taken any meds to relieve discomfort.

## 2023-12-16 NOTE — ED Provider Notes (Signed)
 CAY RALPH PELT    CSN: [id number] Arrival date & time: 12/16/23  0855      History   Chief Complaint Chief Complaint  Patient presents with   Urinary Frequency    Urgency with odor - Entered by patient    HPI LESIA MONICA is a 68 y.o. female.  Patient presents with 10-day history of dysuria, urinary frequency, malodorous urine, bladder pressure.  No fever, abdominal pain, hematuria, flank pain.  She recently completed a course of Macrobid  for a UTI.  She states her symptoms did not improve.  The history is provided by the patient and medical records.    Past Medical History:  Diagnosis Date   Adjustment disorder with mixed anxiety and depressed mood 01/13/2014   Anxiety    Panic attacks   Benign neoplasm of ascending colon    Dysrhythmia    Endometriosis    Female rectocele without uterine prolapse 01/13/2014   Gastroesophageal reflux disease    GERD (gastroesophageal reflux disease)    Hypertension    Other specified diseases of esophagus    Symptomatic PVCs 05/08/2016   Overview:  ETT/echo neg 1/18    Patient Active Problem List   Diagnosis Date Noted   B12 deficiency 01/22/2017   Hyperlipidemia, mixed 01/22/2017   Low serum vitamin D  01/22/2017   Tubular adenoma 01/22/2017   Osteoarthritis of knee 09/19/2016   Special screening for malignant neoplasms, colon    Benign neoplasm of cecum    Benign neoplasm of ascending colon    Gastroesophageal reflux disease    Other specified disease of esophagus    Symptomatic PVCs 05/08/2016   Chronic reflux esophagitis 04/10/2016   Adjustment disorder with mixed anxiety and depressed mood 01/13/2014   Female rectocele without uterine prolapse 01/13/2014    Past Surgical History:  Procedure Laterality Date   BLADDER SURGERY     COLONOSCOPY WITH PROPOFOL  N/A 06/15/2016   Procedure: COLONOSCOPY WITH PROPOFOL ;  Surgeon: Rogelia Copping, MD;  Location: [facility];  Service: Endoscopy;  Laterality: N/A;    ESOPHAGOGASTRODUODENOSCOPY (EGD) WITH PROPOFOL  N/A 06/15/2016   Procedure: ESOPHAGOGASTRODUODENOSCOPY (EGD) WITH PROPOFOL ;  Surgeon: Rogelia Copping, MD;  Location: [facility];  Service: Endoscopy;  Laterality: N/A;   PELVIC LAPAROSCOPY     POLYPECTOMY  06/15/2016   Procedure: POLYPECTOMY;  Surgeon: Rogelia Copping, MD;  Location: [facility];  Service: Endoscopy;;    OB History     Gravida  2   Para  1   Term      Preterm      AB  1   Living  1      SAB  1   IAB      Ectopic      Multiple      Live Births               Home Medications    Prior to Admission medications   Medication Sig Start Date End Date Taking? Authorizing Provider  cephALEXin  (KEFLEX ) 500 MG capsule Take 1 capsule (500 mg total) by mouth 3 (three) times daily for 5 days. 12/16/23 12/21/23 Yes Corlis Burnard DEL, NP  ALPRAZolam  (XANAX ) 0.25 MG tablet TAKE ONE TABLET BY MOUTH AT BEDTIME AS NEEDED FOR ANXIETY 03/21/15   Neysa Inocente PARAS, NP  Ascorbic Acid (VITAMIN C) 1000 MG tablet Take 1,000 mg by mouth daily.    [provider]  DULoxetine  (CYMBALTA ) 60 MG capsule Take 1 capsule (60 mg total) by mouth  daily. 01/18/15   Neysa Inocente PARAS, NP  fluconazole  (DIFLUCAN ) 150 MG tablet Take 1 tablet (150 mg total) by mouth every three (3) days as needed for up to 2 doses. 11/15/23   White, Adrienne R, NP  ipratropium (ATROVENT ) 0.03 % nasal spray Place 2 sprays into both nostrils every 12 (twelve) hours. 05/27/23   Teresa Shelba SAUNDERS, NP  meloxicam  (MOBIC ) 15 MG tablet Take 1 tablet (15 mg total) by mouth daily. 12/23/18   Janit Thresa HERO, DPM  metoprolol tartrate (LOPRESSOR) 25 MG tablet Take 25 mg by mouth daily.    [provider]  olmesartan (BENICAR) 20 MG tablet Take 20 mg by mouth daily.    [provider]  omeprazole  (PRILOSEC) 40 MG capsule  12/19/18   [provider]  zinc gluconate 50 MG tablet Take 50 mg by mouth daily.    [provider]    Family  History Family History  Problem Relation Age of Onset   Hypertension Mother    Cancer Mother        Lung   Stroke Mother    Hypertension Father    Stroke Father    Cancer Father        Lung   Uterine cancer Maternal Grandmother    Cancer Maternal Grandfather        Colon   Breast cancer Neg Hx     Social History Social History   Tobacco Use   Smoking status: Never   Smokeless tobacco: Never  Vaping Use   Vaping status: Never Used  Substance Use Topics   Alcohol use: Yes    Alcohol/week: 3.0 standard drinks of alcohol    Types: 3 Standard drinks or equivalent per week   Drug use: No     Allergies   Patient has no known allergies.   Review of Systems Review of Systems  Constitutional:  Negative for chills and fever.  Gastrointestinal:  Negative for abdominal pain.  Genitourinary:  Positive for dysuria and frequency. Negative for flank pain, hematuria, pelvic pain and vaginal discharge.     Physical Exam Triage Vital Signs ED Triage Vitals  Encounter Vitals Group     BP 12/16/23 0913 (!) 140/77     Girls Systolic BP Percentile --      Girls Diastolic BP Percentile --      Boys Systolic BP Percentile --      Boys Diastolic BP Percentile --      Pulse Rate 12/16/23 0913 80     Resp 12/16/23 0913 17     Temp 12/16/23 0913 98.4 F (36.9 C)     Temp Source 12/16/23 0913 Oral     SpO2 12/16/23 0913 100 %     Weight --      Height --      Head Circumference --      Peak Flow --      Pain Score 12/16/23 0909 0     Pain Loc --      Pain Education --      Exclude from Growth Chart --    No data found.  Updated Vital Signs BP (!) 140/77 (BP Location: Left Arm)   Pulse 80   Temp 98.4 F (36.9 C) (Oral)   Resp 17   SpO2 100%   Visual Acuity Right Eye Distance:   Left Eye Distance:   Bilateral Distance:    Right Eye Near:   Left Eye Near:    Bilateral Near:  Physical Exam Constitutional:      General: She is not in acute distress. HENT:      Mouth/Throat:     Mouth: Mucous membranes are moist.  Cardiovascular:     Rate and Rhythm: Normal rate and regular rhythm.  Pulmonary:     Effort: Pulmonary effort is normal. No respiratory distress.  Abdominal:     General: Bowel sounds are normal.     Palpations: Abdomen is soft.     Tenderness: There is no abdominal tenderness. There is no right CVA tenderness, left CVA tenderness, guarding or rebound.  Neurological:     Mental Status: She is alert.      UC Treatments / Results  Labs (all labs ordered are listed, but only abnormal results are displayed) Labs Reviewed  POCT URINE DIPSTICK - Abnormal; Notable for the following components:      Result Value   pH, UA 8.5 (*)    Leukocytes, UA Small (1+) (*)    All other components within normal limits  URINE CULTURE    EKG   Radiology No results found.  Procedures Procedures (including critical care time)  Medications Ordered in UC Medications - No data to display  Initial Impression / Assessment and Plan / UC Course  I have reviewed the triage vital signs and the nursing notes.  Pertinent labs & imaging results that were available during my care of the patient were reviewed by me and considered in my medical decision making (see chart for details).    UTI without hematuria.  Treating with Keflex . Urine culture pending. Discussed with patient that we will call her if the urine culture shows the need to change or discontinue the antibiotic. Instructed her to follow-up with her PCP.  Patient agrees to plan of care.     Final Clinical Impressions(s) / UC Diagnoses   Final diagnoses:  Urinary tract infection without hematuria, site unspecified     Discharge Instructions      Take the antibiotic as directed.  The urine culture is pending.  We will call you if it shows the need to change or discontinue your antibiotic.    Follow up with your primary care provider.        ED Prescriptions     Medication  Sig Dispense Auth. Provider   cephALEXin  (KEFLEX ) 500 MG capsule Take 1 capsule (500 mg total) by mouth 3 (three) times daily for 5 days. 15 capsule Corlis Burnard DEL, NP      PDMP not reviewed this encounter.   Corlis Burnard DEL, NP 12/16/23 0930

## 2023-12-16 NOTE — Discharge Instructions (Addendum)
Take the antibiotic as directed.  The urine culture is pending.  We will call you if it shows the need to change or discontinue your antibiotic.    Follow up with your primary care provider.    

## 2023-12-20 ENCOUNTER — Ambulatory Visit (HOSPITAL_COMMUNITY): Payer: Self-pay

## 2023-12-20 LAB — URINE CULTURE

## 2023-12-20 LAB — SPECIMEN STATUS REPORT

## 2024-05-20 ENCOUNTER — Other Ambulatory Visit: Payer: Self-pay | Admitting: Internal Medicine

## 2024-05-20 DIAGNOSIS — Z1231 Encounter for screening mammogram for malignant neoplasm of breast: Secondary | ICD-10-CM

## 2024-05-22 ENCOUNTER — Other Ambulatory Visit: Payer: Self-pay | Admitting: Internal Medicine

## 2024-05-22 DIAGNOSIS — E782 Mixed hyperlipidemia: Secondary | ICD-10-CM

## 2024-05-22 DIAGNOSIS — I10 Essential (primary) hypertension: Secondary | ICD-10-CM

## 2024-06-02 ENCOUNTER — Ambulatory Visit
Admission: RE | Admit: 2024-06-02 | Discharge: 2024-06-02 | Disposition: A | Payer: Self-pay | Source: Ambulatory Visit | Attending: Internal Medicine | Admitting: Internal Medicine

## 2024-06-02 DIAGNOSIS — I10 Essential (primary) hypertension: Secondary | ICD-10-CM

## 2024-06-02 DIAGNOSIS — E782 Mixed hyperlipidemia: Secondary | ICD-10-CM

## 2024-06-15 ENCOUNTER — Encounter
